# Patient Record
Sex: Male | Born: 1990 | Race: Black or African American | Hispanic: No | Marital: Single | Smoking: Current every day smoker
Health system: Southern US, Community
[De-identification: ages and names within clinical notes are randomized; demographics above are authoritative.]

## PROBLEM LIST (undated history)

## (undated) DIAGNOSIS — R45851 Suicidal ideations: Secondary | ICD-10-CM

---

## 2020-02-27 ENCOUNTER — Emergency Department (HOSPITAL_COMMUNITY): Payer: No Typology Code available for payment source

## 2020-02-27 ENCOUNTER — Emergency Department (HOSPITAL_COMMUNITY)
Admission: EM | Admit: 2020-02-27 | Discharge: 2020-02-27 | Disposition: A | Discharge status: Home / Self Care | Payer: No Typology Code available for payment source | Attending: Emergency Medicine | Admitting: Emergency Medicine

## 2020-02-27 ENCOUNTER — Other Ambulatory Visit: Payer: Self-pay

## 2020-02-27 ENCOUNTER — Encounter (HOSPITAL_COMMUNITY): Payer: Self-pay | Admitting: Emergency Medicine

## 2020-02-27 DIAGNOSIS — Y92410 Unspecified street and highway as the place of occurrence of the external cause: Secondary | ICD-10-CM | POA: Insufficient documentation

## 2020-02-27 DIAGNOSIS — S20219A Contusion of unspecified front wall of thorax, initial encounter: Secondary | ICD-10-CM | POA: Insufficient documentation

## 2020-02-27 DIAGNOSIS — S299XXA Unspecified injury of thorax, initial encounter: Secondary | ICD-10-CM | POA: Diagnosis present

## 2020-02-27 DIAGNOSIS — K760 Fatty (change of) liver, not elsewhere classified: Secondary | ICD-10-CM | POA: Diagnosis not present

## 2020-02-27 DIAGNOSIS — R1011 Right upper quadrant pain: Secondary | ICD-10-CM | POA: Diagnosis not present

## 2020-02-27 LAB — CBC
HCT: 43.6 % (ref 39.0–52.0)
Hemoglobin: 15.3 g/dL (ref 13.0–17.0)
MCH: 29.9 pg (ref 26.0–34.0)
MCHC: 35.1 g/dL (ref 30.0–36.0)
MCV: 85.2 fL (ref 80.0–100.0)
Platelets: 243 10*3/uL (ref 150–400)
RBC: 5.12 MIL/uL (ref 4.22–5.81)
RDW: 13 % (ref 11.5–15.5)
WBC: 14.2 10*3/uL — ABNORMAL HIGH (ref 4.0–10.5)
nRBC: 0 % (ref 0.0–0.2)

## 2020-02-27 LAB — BASIC METABOLIC PANEL
Anion gap: 12 (ref 5–15)
BUN: 10 mg/dL (ref 6–20)
CO2: 22 mmol/L (ref 22–32)
Calcium: 9.4 mg/dL (ref 8.9–10.3)
Chloride: 103 mmol/L (ref 98–111)
Creatinine, Ser: 1.16 mg/dL (ref 0.61–1.24)
GFR, Estimated: >60 mL/min (ref >60)
Glucose, Bld: 86 mg/dL (ref 70–99)
Potassium: 4.5 mmol/L (ref 3.5–5.1)
Sodium: 137 mmol/L (ref 135–145)

## 2020-02-27 MED ORDER — OXYCODONE-ACETAMINOPHEN 5-325 MG PO TABS
1.0000 | ORAL_TABLET | ORAL | Status: DC | PRN
  Administered 2020-02-27: 1 via ORAL
  Filled 2020-02-27: qty 1

## 2020-02-27 MED ORDER — FENTANYL CITRATE (PF) 100 MCG/2ML IJ SOLN
50.0000 ug | Freq: Once | INTRAMUSCULAR | Status: AC
  Administered 2020-02-27: 50 ug via INTRAVENOUS
  Filled 2020-02-27: qty 2

## 2020-02-27 MED ORDER — NAPROXEN 500 MG PO TABS: 500.0000 mg | ORAL_TABLET | Freq: Two times a day (BID) | ORAL | 0 refills | Status: DC

## 2020-02-27 MED ORDER — FENTANYL CITRATE (PF) 100 MCG/2ML IJ SOLN: 100.0000 ug | Freq: Once | INTRAMUSCULAR | Status: DC

## 2020-02-27 MED ORDER — IOHEXOL 300 MG/ML  SOLN
99.0000 mL | Freq: Once | INTRAMUSCULAR | Status: AC | PRN
  Administered 2020-02-27: 99 mL via INTRAVENOUS

## 2020-02-27 MED ORDER — METHOCARBAMOL 500 MG PO TABS: 1000.0000 mg | ORAL_TABLET | Freq: Four times a day (QID) | ORAL | 0 refills | Status: DC

## 2020-02-27 NOTE — ED Notes (Signed)
pts grandmother Corrie Dandy wants a call when pt gets back to a room.
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
## 2020-02-27 NOTE — ED Notes (Signed)
Pt approached this tech stating he feels sob. Let Clydie Braun Triage RN know.
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
## 2020-02-27 NOTE — ED Notes (Signed)
Patient verbalizes understanding of discharge instructions. Opportunity for questioning and answers were provided. Armband removed by staff, pt discharged from ED.  

## 2020-02-27 NOTE — ED Notes (Addendum)
Pt reports pain across chest and SOB.  Brought back to triage for EKG and vitals.  Chest x-ray already resulted.
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
## 2020-11-25 ENCOUNTER — Emergency Department (HOSPITAL_COMMUNITY): Payer: Self-pay

## 2020-11-25 ENCOUNTER — Emergency Department (HOSPITAL_COMMUNITY)
Admission: EM | Admit: 2020-11-25 | Discharge: 2020-11-28 | Disposition: A | Discharge status: Other Acute Inpatient Hospital | Payer: Self-pay | Attending: Emergency Medicine | Admitting: Emergency Medicine

## 2020-11-25 ENCOUNTER — Other Ambulatory Visit: Payer: Self-pay

## 2020-11-25 DIAGNOSIS — F29 Unspecified psychosis not due to a substance or known physiological condition: Secondary | ICD-10-CM | POA: Insufficient documentation

## 2020-11-25 DIAGNOSIS — F4323 Adjustment disorder with mixed anxiety and depressed mood: Secondary | ICD-10-CM | POA: Diagnosis present

## 2020-11-25 DIAGNOSIS — M940 Chondrocostal junction syndrome [Tietze]: Secondary | ICD-10-CM | POA: Insufficient documentation

## 2020-11-25 DIAGNOSIS — Z20822 Contact with and (suspected) exposure to covid-19: Secondary | ICD-10-CM | POA: Insufficient documentation

## 2020-11-25 DIAGNOSIS — F1410 Cocaine abuse, uncomplicated: Secondary | ICD-10-CM | POA: Insufficient documentation

## 2020-11-25 DIAGNOSIS — R45851 Suicidal ideations: Secondary | ICD-10-CM

## 2020-11-25 LAB — ACETAMINOPHEN LEVEL: Acetaminophen (Tylenol), Serum: <10 ug/mL — ABNORMAL LOW (ref 10–30)

## 2020-11-25 LAB — BASIC METABOLIC PANEL
Anion gap: 10 (ref 5–15)
BUN: 9 mg/dL (ref 6–20)
CO2: 25 mmol/L (ref 22–32)
Calcium: 9.2 mg/dL (ref 8.9–10.3)
Chloride: 102 mmol/L (ref 98–111)
Creatinine, Ser: 1.14 mg/dL (ref 0.61–1.24)
GFR, Estimated: >60 mL/min (ref >60)
Glucose, Bld: 84 mg/dL (ref 70–99)
Potassium: 3.6 mmol/L (ref 3.5–5.1)
Sodium: 137 mmol/L (ref 135–145)

## 2020-11-25 LAB — CBC WITH DIFFERENTIAL/PLATELET
Abs Immature Granulocytes: 0.03 10*3/uL (ref 0.00–0.07)
Basophils Absolute: 0.1 10*3/uL (ref 0.0–0.1)
Basophils Relative: 1 %
Eosinophils Absolute: 1 10*3/uL — ABNORMAL HIGH (ref 0.0–0.5)
Eosinophils Relative: 11 %
HCT: 43.6 % (ref 39.0–52.0)
Hemoglobin: 14.4 g/dL (ref 13.0–17.0)
Immature Granulocytes: 0 %
Lymphocytes Relative: 30 %
Lymphs Abs: 2.8 10*3/uL (ref 0.7–4.0)
MCH: 28.4 pg (ref 26.0–34.0)
MCHC: 33 g/dL (ref 30.0–36.0)
MCV: 86 fL (ref 80.0–100.0)
Monocytes Absolute: 0.8 10*3/uL (ref 0.1–1.0)
Monocytes Relative: 8 %
Neutro Abs: 4.7 10*3/uL (ref 1.7–7.7)
Neutrophils Relative %: 50 %
Platelets: 266 10*3/uL (ref 150–400)
RBC: 5.07 MIL/uL (ref 4.22–5.81)
RDW: 12.7 % (ref 11.5–15.5)
WBC: 9.3 10*3/uL (ref 4.0–10.5)
nRBC: 0 % (ref 0.0–0.2)

## 2020-11-25 LAB — RESP PANEL BY RT-PCR (FLU A&B, COVID) ARPGX2
Influenza A by PCR: NEGATIVE
Influenza B by PCR: NEGATIVE
SARS Coronavirus 2 by RT PCR: NEGATIVE

## 2020-11-25 LAB — SALICYLATE LEVEL: Salicylate Lvl: <7 mg/dL — ABNORMAL LOW (ref 7.0–30.0)

## 2020-11-25 LAB — ETHANOL: Alcohol, Ethyl (B): <10 mg/dL (ref <10)

## 2020-11-25 MED ORDER — NAPROXEN 250 MG PO TABS
500.0000 mg | ORAL_TABLET | Freq: Once | ORAL | Status: AC
  Administered 2020-11-25: 500 mg via ORAL
  Filled 2020-11-25: qty 2

## 2020-11-25 NOTE — ED Provider Notes (Signed)
Patient observed throughout the afternoon awaiting placement and behavioral health recommendations.  On assessment patient is cooperative, no distress.  Patient having thoughts of suicide.  Patient voluntarily would like placement and help.  The placement that was found initially to go to [facility] patient prefers to go to a different facility.  Plan to await further behavioral health recommendations for other options.  Kenton Kingfisher, MD 11/25/20 [phone]

## 2020-11-25 NOTE — ED Notes (Signed)
Pt belongings placed in locker 2 

## 2020-11-25 NOTE — BH Assessment (Addendum)
Clinician updated shift report to clarify that pt is seeking assistance for his mental health but is refusing to be transferred to the [facility] at this time.  2338: Pt's referral information was relayed to [facility] Tosin, RN, for review for potential acceptance to [facility].

## 2020-11-25 NOTE — ED Provider Notes (Signed)
Emergency Medicine Provider Triage Evaluation Note  Eugene Steele , a 30 y.o. male  was evaluated in triage.  Pt complains of central chest pain x 4 hours which has been constant. Worse with palpation and breathing. Had temp of 100.2F in triage, but no other known fevers PTA. Denies congestion, cough, hemoptysis, leg swelling, sick contacts. No medications PTA for symptoms. Smokes 1 pack of cigarettes q 3 days.  Review of Systems  Positive: Chest pain Negative: Cough, congestion, hemoptysis  Physical Exam  Ht 5\' 10"  (1.778 m)   Wt 99.3 kg   BMI 31.42 kg/m  Gen:   Awake, no distress   Resp:  Normal effort  MSK:   Moves extremities without difficulty  Other:  Lungs grossly CTAB  Medical Decision Making  Medically screening exam initiated at 12:34 AM.  Appropriate orders placed.  Eugene Steele was informed that the remainder of the evaluation will be completed by another provider, this initial triage assessment does not replace that evaluation, and the importance of remaining in the ED until their evaluation is complete.  Chest pain - low grade temp. Plan for respiratory panel, CXR, EKG. Naproxen ordered pending formal evaluation.   Eugene Cancer, PA-C 11/25/20 0036    11/27/20, MD 11/25/20 [phone]
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
## 2020-11-25 NOTE — ED Notes (Signed)
Called 3 x no answer

## 2020-11-25 NOTE — BH Assessment (Signed)
Comprehensive Clinical Assessment (CCA) Note  11/25/2020 Eugene Steele [id number]  Disposition: Per Delphia Grates NP, patient is recommended for admission to [facility]).  [place] ED from 11/25/2020 in [place] ED from 02/27/2020 in [place] High Risk No Risk      The patient demonstrates the following risk factors for suicide: Chronic risk factors for suicide include: substance use disorder and previous suicide attempts x1 . Acute risk factors for suicide include: family or marital conflict, unemployment, and loss (financial, interpersonal, professional). Protective factors for this patient include: hope for the future. Considering these factors, the overall suicide risk at this point appears to be moderate. Patient is not appropriate for outpatient follow up.  Eugene Steele is a 30 year old male presenting to [facility] voluntarily with chief complaint of chest pain and SI with a plan to use a gun to kill himself. Patient somewhat agitated at the beginning of the assessment and states that he has repeated himself at least five times about what is going on with him and he did not feel like repeating it again. Patient states "I'm going through a lot, and I want to kill myself". Patient reports suicidal ideations for years which has worsened since August. Patient reports stressors related to losing his housing and not being able to find employment with a 4-year degree from [employer]. Patient reports he has a plan, but he is choosing not to "disclose that information". Patient informed EDP that he had a plant to shoot himself with a gun. Patient acknowledges this information and reports that his gun is at his grandmother house. Patient reports one suicide attempt last year by overdosing on fentanyl. Patient reports using fentanyl once to try and kill himself and denies any use after  that.  Patient reports issues with anxiety and depression but denies history of outpatient/inpatient treatment. Patient initially denies any drug use besides THC but later reports that he uses crack daily for the past month. Patient reports denies heavy alcohol use and the use of any other drugs. Patient has been living in [place] since December. Patient is currently homeless unsheltered since yesterday. Patient reports his grandmother helped him stay in a hotel for the past two weeks. Patient reports he has been homeless since August. Patient reports he can not live with his grandmother due to conflict with his grandfather. Patient also reports that his gun is at his grandmother house. Patient reports that his mother lives in [place] and he can live with her once he goes through detox.   Patient is oriented x4, alert and engaged. Patient is initially agitated but cooperates more as the assessment progress. Patient eye contact and speech is normal. Patient affect is appropriate with congruent mood. Patient reports SI with a plan to shoot himself and he is not able to fully contract for safety. Patient is open to detox and SA services preferably in [place] near his mother and contracts for safety contingent on receiving SA services. Patient denies HI, history of violence, AVH and SIB.   Chief Complaint:  Chief Complaint  Patient presents with   Chest Pain   Visit Diagnosis: Cocaine abuse                             Suicidal ideations    CCA Screening, Triage and Referral (STR)  Patient Reported Information How did you hear about [place]?  No data recorded What Is the Reason for Your Visit/Call Today? During discussion of costochondritis the patient does report active SI to this provider.  He reports that he has a plan to go home and "use a gun" that he has access to to "blow his brains out."  He reports to this provider that he has mentioned this SI several times during the course of his ED  evaluation today.  He denies prior or recent psychiatric care in the outpatient setting.  He is requesting psych evaluation.  How Long Has This Been Causing You Problems? 1-6 months  What Do You Feel Would Help You the Most Today? Treatment for Depression or other mood problem   Have You Recently Had Any Thoughts About Hurting Yourself? Yes  Are You Planning to Commit Suicide/Harm Yourself At This time? Yes   Have you Recently Had Thoughts About Hurting Someone Eugene Steele? No  Are You Planning to Harm Someone at This Time? No  Explanation: No data recorded  Have You Used Any Alcohol or Drugs in the Past 24 Hours? Yes  How Long Ago Did You Use Drugs or Alcohol? No data recorded What Did You Use and How Much? crack   Do You Currently Have a Therapist/Psychiatrist? No  Name of Therapist/Psychiatrist: No data recorded  Have You Been Recently Discharged From Any Office Practice or Programs? No  Explanation of Discharge From Practice/Program: No data recorded    CCA Screening Triage Referral Assessment Type of Contact: Tele-Assessment  Telemedicine Service Delivery: Telemedicine service delivery: This service was provided via telemedicine using a 2-way, interactive audio and video technology  Is this Initial or Reassessment? Initial Assessment  Date Telepsych consult ordered in CHL:  11/25/20  Time Telepsych consult ordered in CHL:  No data recorded Location of Assessment: [facility] ED  Provider Location: [facility]   Collateral Involvement: none   Does Patient Have a [place]? No data recorded Name and Contact of Legal Guardian: No data recorded If Minor and Not Living with Parent(s), Who has Custody? No data recorded Is CPS involved or ever been involved? No data recorded Is APS involved or ever been involved? No data recorded  Patient Determined To Be At Risk for Harm To Self or Others Based on Review of Patient Reported Information or  Presenting Complaint? Yes, for Self-Harm  Method: No data recorded Availability of Means: No data recorded Intent: No data recorded Notification Required: No data recorded Additional Information for Danger to Others Potential: No data recorded Additional Comments for Danger to Others Potential: No data recorded Are There Guns or Other Weapons in Your Home? No data recorded Types of Guns/Weapons: No data recorded Are These Weapons Safely Secured?                            No data recorded Who Could Verify You Are Able To Have These Secured: No data recorded Do You Have any Outstanding Charges, Pending Court Dates, Parole/Probation? No data recorded Contacted To Inform of Risk of Harm To Self or Others: No data recorded   Does Patient Present under Involuntary Commitment? No  IVC Papers Initial File Date: No data recorded  [place] of Residence: [place]   Patient Currently Receiving the Following Services: No data recorded  Determination of Need: Urgent (48 hours)   Options For Referral: Medication Management; Facility-Based Crisis; Outpatient Therapy     CCA Biopsychosocial Patient Reported Schizophrenia/Schizoaffective Diagnosis in  Past: No   Strengths: No data recorded  Mental Health Symptoms Depression:   Change in energy/activity; Difficulty Concentrating; Hopelessness; Irritability; Sleep (too much or little); Tearfulness; Worthlessness   Duration of Depressive symptoms:  Duration of Depressive Symptoms: Greater than two weeks   Mania:   None   Anxiety:    Irritability; Sleep; Tension; Worrying   Psychosis:   None   Duration of Psychotic symptoms:    Trauma:   None   Obsessions:   None   Compulsions:   None   Inattention:   None   Hyperactivity/Impulsivity:   None   Oppositional/Defiant Behaviors:   None   Emotional Irregularity:   None   Other Mood/Personality Symptoms:  No data recorded   Mental Status Exam Appearance and self-care   Stature:   Average   Weight:   Average weight   Clothing:   Age-appropriate; Neat/clean   Grooming:   Normal   Cosmetic use:   None   Posture/gait:   Normal   Motor activity:   Not Remarkable   Sensorium  Attention:   Normal   Concentration:   Normal   Orientation:   X5   Recall/memory:   Normal   Affect and Mood  Affect:   Appropriate; Congruent   Mood:   Irritable   Relating  Eye contact:   Normal   Facial expression:   Responsive   Attitude toward examiner:   Cooperative; Guarded; Irritable   Thought and Language  Speech flow:  Clear and Coherent   Thought content:   Appropriate to Mood and Circumstances   Preoccupation:   None   Hallucinations:   None   Organization:  No data recorded  [employer] of Knowledge:   Good   Intelligence:   Average   Abstraction:   Normal   Judgement:   Fair   Reality Testing:   Adequate   Insight:   Fair   Decision Making:   Normal   Social Functioning  Social Maturity:   Responsible   Social Judgement:   Normal   Stress  Stressors:   Housing; Family conflict; Financial   Coping Ability:   Exhausted; Overwhelmed   Skill Deficits:   None   Supports:   Support needed; Family     Religion:    Leisure/Recreation:    Exercise/Diet: Exercise/Diet Do You Follow a Special Diet?: No Do You Have Any Trouble Sleeping?: Yes   CCA Employment/Education Employment/Work Situation: Employment / Work Situation Employment Situation: Unemployed  Education: Education Is Patient Currently Attending School?: No Did Theme park manager?: Yes   CCA Family/Childhood History Family and Relationship History: Family history Marital status: Single Does patient have children?: No  Childhood History:  Childhood History By whom was/is the patient raised?: Both parents Did patient suffer any verbal/emotional/physical/sexual abuse as a child?: No Did patient  suffer from severe childhood neglect?: No  Child/Adolescent Assessment:     CCA Substance Use Alcohol/Drug Use: Alcohol / Drug Use Pain Medications: See MAR Prescriptions: See MAR Over the Counter: See MAR History of alcohol / drug use?: Yes Negative Consequences of Use: Personal relationships, Financial Substance #1 Name of Substance 1: THC 1 - Duration: uses 2-4 times a week, ongoing Substance #2 Name of Substance 2: Crack 2 - Age of First Use: unknown 2 - Frequency: daily 2 - Duration: 7 months 2 - Last Use / Amount: last night  ASAM's:  Six Dimensions of Multidimensional Assessment  Dimension 1:  Acute Intoxication and/or Withdrawal Potential:      Dimension 2:  Biomedical Conditions and Complications:      Dimension 3:  Emotional, Behavioral, or Cognitive Conditions and Complications:     Dimension 4:  Readiness to Change:     Dimension 5:  Relapse, Continued use, or Continued Problem Potential:     Dimension 6:  Recovery/Living Environment:     ASAM Severity Score:    ASAM Recommended Level of Treatment: ASAM Recommended Level of Treatment: Level II Intensive Outpatient Treatment   Substance use Disorder (SUD)    Recommendations for Services/Supports/Treatments: Recommendations for Services/Supports/Treatments Recommendations For Services/Supports/Treatments: Detox, Facility Based Crisis  Discharge Disposition:    DSM5 Diagnoses: There are no problems to display for this patient.    Referrals to Alternative Service(s): Referred to Alternative Service(s):   Place:   Date:   Time:    Referred to Alternative Service(s):   Place:   Date:   Time:    Referred to Alternative Service(s):   Place:   Date:   Time:    Referred to Alternative Service(s):   Place:   Date:   Time:     Luther Redo, [facility]

## 2020-11-25 NOTE — ED Notes (Signed)
Pt changed into appropriate scrubs. Provided drinks. Will provided sandwich bags when they are delivered.

## 2020-11-25 NOTE — ED Triage Notes (Signed)
Pt c/o chest pain that started 4 hours ago.

## 2020-11-25 NOTE — ED Notes (Signed)
Called safe transport to take patient to [facility]

## 2020-11-25 NOTE — ED Provider Notes (Signed)
[facility] EMERGENCY DEPARTMENT Provider Note   CSN: [id number] Arrival date & time: 11/25/20  0026     History Chief Complaint  Patient presents with   Chest Pain    Eugene Steele is a 30 y.o. male.  30 year old male with no medical history as detailed below presents for evaluation.  Patient complains of right-sided chest discomfort.  This chest discomfort started yesterday evening.  Patient reports that the pain is worse with movement.  He denies associated shortness of breath or nausea.  He denies diaphoresis.  Pain is improved after administration of Naprosyn while waiting for evaluation.  Patient denies any specific inciting event.  Patient's exam and history are consistent with likely costochondritis.  Presentation is not suggestive of ACS.  During discussion of costochondritis the patient does report active SI to this provider.  He reports that he has a plan to go home and "use a gun" that he has access to to "blow his brains out."  He reports to this provider that he has mentioned this SI several times during the course of his ED evaluation today.  He denies prior or recent psychiatric care in the outpatient setting.  He is requesting psych evaluation.  The history is provided by the patient.  Chest Pain Pain location:  R lateral chest Pain quality: aching   Pain radiates to:  Does not radiate Pain severity:  Mild Onset quality:  Gradual Duration:  1 day Timing:  Constant Progression:  Partially resolved     No past medical history on file.  There are no problems to display for this patient.   No past surgical history on file.     No family history on file.  Social History   Tobacco Use   Smoking status: Never   Smokeless tobacco: Never  Substance Use Topics   Alcohol use: Never   Drug use: Never    Home Medications Prior to Admission medications   Medication Sig Start Date End Date Taking? Authorizing Provider   methocarbamol (ROBAXIN) 500 MG tablet Take 2 tablets (1,000 mg total) by mouth 4 (four) times daily. 02/27/20   Renne Crigler, PA-C  naproxen (NAPROSYN) 500 MG tablet Take 1 tablet (500 mg total) by mouth 2 (two) times daily. 02/27/20   Renne Crigler, PA-C    Allergies    Patient has no known allergies.  Review of Systems   Review of Systems  Cardiovascular:  Positive for chest pain.  Psychiatric/Behavioral:  Positive for suicidal ideas.    Physical Exam Updated Vital Signs BP 131/86 (BP Location: Right Arm)   Pulse (!) 51   Temp 98.9 F (37.2 C)   Resp 18   Ht 5\' 10"  (1.778 m)   Wt 99.3 kg   SpO2 100%   BMI 31.42 kg/m   Physical Exam Vitals and nursing note reviewed.  Constitutional:      General: He is not in acute distress.    Appearance: Normal appearance. He is well-developed.  HENT:     Head: Normocephalic and atraumatic.  Eyes:     Conjunctiva/sclera: Conjunctivae normal.     Pupils: Pupils are equal, round, and reactive to light.  Cardiovascular:     Rate and Rhythm: Normal rate and regular rhythm.     Heart sounds: Normal heart sounds.  Pulmonary:     Effort: Pulmonary effort is normal. No respiratory distress.     Breath sounds: Normal breath sounds.  Chest:     Chest wall: Tenderness present.  Comments: Reproducible chest discomfort - tenderness over right sternum reproduces patient's reported symptoms entirely. Abdominal:     General: There is no distension.     Palpations: Abdomen is soft.     Tenderness: There is no abdominal tenderness.  Musculoskeletal:        General: No deformity. Normal range of motion.     Cervical back: Normal range of motion and neck supple.  Skin:    General: Skin is warm and dry.  Neurological:     General: No focal deficit present.     Mental Status: He is alert and oriented to person, place, and time.  Psychiatric:     Comments: Reports SI with plan to "blow brains out with a gun." He reports access to gun at  home.     ED Results / Procedures / Treatments   Labs (all labs ordered are listed, but only abnormal results are displayed) Labs Reviewed  RESP PANEL BY RT-PCR (FLU A&B, COVID) ARPGX2  BASIC METABOLIC PANEL  ETHANOL  RAPID URINE DRUG SCREEN, HOSP PERFORMED  SALICYLATE LEVEL  ACETAMINOPHEN LEVEL  CBC WITH DIFFERENTIAL/PLATELET    EKG EKG Interpretation  Date/Time:  Monday November 25 2020 00:42:28 EST Ventricular Rate:  58 PR Interval:  140 QRS Duration: 74 QT Interval:  404 QTC Calculation: 396 R Axis:   56 Text Interpretation: Sinus bradycardia Nonspecific T wave abnormality Abnormal ECG When compared with ECG of 02/27/2020, HEART RATE has decreased Confirmed by Dione Booze ([id number]) on 11/25/2020 1:28:13 AM  Radiology DG Chest 2 View  Result Date: 11/25/2020 CLINICAL DATA:  Chest pain EXAM: CHEST - 2 VIEW COMPARISON:  02/27/2020 FINDINGS: The heart size and mediastinal contours are within normal limits. Both lungs are clear. The visualized skeletal structures are unremarkable. IMPRESSION: No active cardiopulmonary disease. Electronically Signed   By: Helyn Numbers M.D.   On: 11/25/2020 01:08    Procedures Procedures   Medications Ordered in ED Medications  naproxen (NAPROSYN) tablet 500 mg (500 mg Oral Given 11/25/20 0049)    ED Course  I have reviewed the triage vital signs and the nursing notes.  Pertinent labs & imaging results that were available during my care of the patient were reviewed by me and considered in my medical decision making (see chart for details).    MDM Rules/Calculators/A&P                           MDM  MSE complete  Eugene Steele was evaluated in Emergency Department on 11/25/2020 for the symptoms described in the history of present illness. He was evaluated in the context of the global COVID-19 pandemic, which necessitated consideration that the patient might be at risk for infection with the SARS-CoV-2 virus that causes  COVID-19. Institutional protocols and algorithms that pertain to the evaluation of patients at risk for COVID-19 are in a state of rapid change based on information released by regulatory bodies including the CDC and federal and state organizations. These policies and algorithms were followed during the patient's care in the ED.  Patient is presenting with reported right-sided chest discomfort.  Patient described history and physical are consistent with likely costochondritis  During exam patient reported active SI with plan to go home and "shoot himself in the head."  He is requesting TTS eval.  He is medically clear at this time for psychiatric evaluation.  Final disposition is dependent upon psychiatric plan of care.   Final Clinical Impression(s) /  ED Diagnoses Final diagnoses:  Costochondritis  Suicidal ideation    Rx / DC Orders ED Discharge Orders     None        Wynetta Fines, MD 11/25/20 1444

## 2020-11-26 ENCOUNTER — Encounter (HOSPITAL_COMMUNITY): Payer: Self-pay | Admitting: Registered Nurse

## 2020-11-26 DIAGNOSIS — R45851 Suicidal ideations: Secondary | ICD-10-CM

## 2020-11-26 DIAGNOSIS — F4323 Adjustment disorder with mixed anxiety and depressed mood: Secondary | ICD-10-CM

## 2020-11-26 MED ORDER — ZIPRASIDONE MESYLATE 20 MG IM SOLR
INTRAMUSCULAR | Status: AC
  Filled 2020-11-26: qty 20

## 2020-11-26 MED ORDER — ZIPRASIDONE MESYLATE 20 MG IM SOLR
20.0000 mg | Freq: Once | INTRAMUSCULAR | Status: AC
  Administered 2020-11-26: 20 mg via INTRAMUSCULAR
  Filled 2020-11-26: qty 20

## 2020-11-26 NOTE — ED Notes (Signed)
After pt was told he was being placed under IVC pt started walking out to the front door. Pt was intercepted by security and staff. Pt placed in STARR walking assist and taken back to assigned bed. Pt had an outburst and punched the privacy screen. Pt currently refusing to comply with this RN or security.

## 2020-11-26 NOTE — Progress Notes (Signed)
Inpatient Behavioral Health Placement  Pt meets inpatient criteria per Assunta Found, NP. Per [facility] AC Oluwatosin, RN there are no appropriate beds at [facility]Referral was sent to the following facilities:   Destination Service Provider Address Phone Fax  [facility]  [address] [place] [address] [phone]  [facility]  [address] [place] [address] [phone]  [facility]  [address] [place], [place] [address] [phone]  [facility]  [address] [place], [place] [address] [phone]  [facility]  [address] [place] [address] [phone]  [facility]  [address] [place] [address] [phone]  [facility]  [address], [place] [address] [phone]  [facility]  [address] [place] [address] [phone]  [facility]  [address], [place] [address] [phone]  [facility]  [address], [place] [address] [phone]  [facility]  [address], [place] [address] [phone]  [facility]  [address] [place] [address] [phone]  [facility]  [address] [place] [address] [phone]  [facility]  [address] [place], [place] [address] [phone]  [facility]  [address] [place] [address] [phone]    Situation ongoing,  CSW will follow up.   Maryjean Ka, MSW, LCSWA 11/26/2020  @ 11:14 PM

## 2020-11-26 NOTE — ED Notes (Signed)
Pt requesting an update from the MD.EDP notified.Eugene Steele

## 2020-11-26 NOTE — ED Notes (Signed)
Pt gradually escalating, wanting to leave. Attempted to verbally deescalate pt. Pt became quite and sitting on stretcher with arms crossed. EDP reports IVC is forthcoming.

## 2020-11-26 NOTE — ED Notes (Signed)
Spoke with pt's grandmother and she reports she is unable to accept the pt on a Engineer, manufacturing systems. She reports that pt has made threatening remarks to family members.

## 2020-11-26 NOTE — ED Notes (Signed)
Belongings returned to locker room #2. Valuables given to security.

## 2020-11-26 NOTE — ED Notes (Signed)
Pt to nurses station saying that he wants to leave and is denying SI/HI at this time. MD and psych NP made aware

## 2020-11-26 NOTE — ED Notes (Addendum)
Pt received another placement at [facility] and they would evaluate him and place him. Pt refuses to go, states he doesn't want to go somewhere he doesn't know and wants to wait for [facility]. Explained to pt that it could be days before [facility] can take him and that they may not be able to take him, that in the ED he isn't getting the help his needs without a behavioral health placement. Pt continues to refuse to accept placement. MD notified. No additional orders received. No acute changes noted. Will continue to monitor.

## 2020-11-26 NOTE — ED Notes (Signed)
EDP at bedside discussing with pt that he will be placed as an IVC.

## 2020-11-26 NOTE — ED Provider Notes (Addendum)
Emergency Medicine Observation Re-evaluation Note  Eugene Steele is a 30 y.o. male, seen on rounds today.  Pt initially presented to the ED for complaints of Chest Pain Currently, the patient is resting.  Physical Exam  BP 123/69 (BP Location: Right Arm)   Pulse (!) 53   Temp 98.2 F (36.8 C)   Resp 16   Ht 5\' 10"  (1.778 m)   Wt 99.3 kg   SpO2 99%   BMI 31.42 kg/m  Physical Exam General: calm, resting Cardiac: warm, well perfused Lungs: even ulabored Psych: calm right now  ED Course / MDM  EKG:EKG Interpretation  Date/Time:  Monday November 25 2020 00:42:28 EST Ventricular Rate:  58 PR Interval:  140 QRS Duration: 74 QT Interval:  404 QTC Calculation: 396 R Axis:   56 Text Interpretation: Sinus bradycardia Nonspecific T wave abnormality Abnormal ECG When compared with ECG of 02/27/2020, HEART RATE has decreased Confirmed by 02/29/2020 (Dione Booze) on 11/25/2020 1:28:13 AM  I have reviewed the labs performed to date as well as medications administered while in observation.  Recent changes in the last 24 hours include overnight .  Plan  Current plan is for placement per psych.  Eugene Steele is not under involuntary commitment.     Ruthann Cancer, MD 11/26/20 0915  2:00 PM ADDENDUM - psych reevaluated and continues to recommend in patient due to concerns for suicidal ideation, have completed IVC paperwork. Updated staff.     11/28/20, MD 11/26/20 2233

## 2020-11-26 NOTE — Consult Note (Signed)
Telepsych Consultation   Reason for Consult:  Suicidal ideation Referring Physician:  Valarie Merino, MD Location of Patient: [facility] ED Location of Provider: Other: [facility]  Patient Identification: Eugene Steele MRN:  [id number] Principal Diagnosis: Acute adjustment disorder with mixed anxiety and depressed mood Diagnosis:  Principal Problem:   Acute adjustment disorder with mixed anxiety and depressed mood Active Problems:   Suicidal ideation   Total Time spent with patient: 30 minutes  Subjective:   Eugene Steele is a 30 y.o. male patient admitted to [facility] ED sent from [facility] where he initially presented with complaints of suicidal ideation with intent and plan.  HPI:  Eugene Steele, 30 y.o., male patient seen via tele health by this provider, consulted with Dr. Hampton Abbot; and chart reviewed on 11/26/20.  On evaluation Eugene Steele reports he is feeling better and wants to go home today.  "I just said that I want to go home.  I do not want to kill myself.  I do not want to think about that right now.  My grandmother called and said I had an interview and I just want to go home.  I've been unemployed for 8 months and just to get a call about interview has Gotten excited and ready to get the hell out of here.  Do I have to just walk to hell out of here."  Patient refuses to let anyone speak to his grandmother for collateral information.  Patient asked about access to a gun and reports that the again is at his cousin's house but refuses to let anyone speak to his because it.  Patient informed there would need to be a safety plan and family support before discharging home related to his earlier complaint of suicidal ideation with intent and plan.  Patient reported that he had access to a gun and was going to blow his brains out.  Patient will not contract for safety and he will not state that he is not going to kill himself only stating that he does not want to talk about it right  now. During evaluation Eugene Steele is sitting on end of bed in no acute distress.  He is alert, oriented x 4; his mood is agitated only asking minimal questions.  Repeating that he wants to leave and cursing.  He does not appear to be responding to internal/external stimuli or delusional thoughts.  Patient denies suicidal/self-harm/homicidal ideation, psychosis, and paranoia.  And is requesting to go home but refusing to contract for safety or to let any staff speak with family members  Spoke with Dr. Roslynn Amble by phone and reports that collateral information was received by patient's grandmother who informed that patient has threatened family and are fearful for him to be sent home.  Grandmother feels the patient needs psychiatric admission.    Past Psychiatric History: Denies  Risk to Self: Yes Risk to Others: Yes Prior Inpatient Therapy: Denies Prior Outpatient Therapy: Denies  Past Medical History: History reviewed. No pertinent past medical history. History reviewed. No pertinent surgical history. Family History: History reviewed. No pertinent family history. Family Psychiatric  History: Unaware Social History:  Social History   Substance and Sexual Activity  Alcohol Use Never     Social History   Substance and Sexual Activity  Drug Use Never    Social History   Socioeconomic History   Marital status: Single    Spouse name: Not on file   Number of children: Not on file   Years  of education: Not on file   Highest education level: Not on file  Occupational History   Not on file  Tobacco Use   Smoking status: Never   Smokeless tobacco: Never  Substance and Sexual Activity   Alcohol use: Never   Drug use: Never   Sexual activity: Not on file  Other Topics Concern   Not on file  Social History Narrative   Not on file   Social Determinants of Health   Financial Resource Strain: Not on file  Food Insecurity: Not on file  Transportation Needs: Not on file   Physical Activity: Not on file  Stress: Not on file  Social Connections: Not on file   Additional Social History:    Allergies:  No Known Allergies  Labs:  Results for orders placed or performed during the hospital encounter of 11/25/20 (from the past 48 hour(s))  Resp Panel by RT-PCR (Flu A&B, Covid) Nasopharyngeal Swab     Status: None   Collection Time: 11/25/20 12:49 AM   Specimen: Nasopharyngeal Swab; Nasopharyngeal(NP) swabs in vial transport medium  Result Value Ref Range   SARS Coronavirus 2 by RT PCR NEGATIVE NEGATIVE    Comment: (NOTE) SARS-CoV-2 target nucleic acids are NOT DETECTED.  The SARS-CoV-2 RNA is generally detectable in upper respiratory specimens during the acute phase of infection. The lowest concentration of SARS-CoV-2 viral copies this assay can detect is 138 copies/mL. A negative result does not preclude SARS-Cov-2 infection and should not be used as the sole basis for treatment or other patient management decisions. A negative result may occur with  improper specimen collection/handling, submission of specimen other than nasopharyngeal swab, presence of viral mutation(s) within the areas targeted by this assay, and inadequate number of viral copies(<138 copies/mL). A negative result must be combined with clinical observations, patient history, and epidemiological information. The expected result is Negative.  Fact Sheet for Patients:  EntrepreneurPulse.com.au  Fact Sheet for Healthcare Providers:  IncredibleEmployment.be  This test is no t yet approved or cleared by the [place] FDA and  has been authorized for detection and/or diagnosis of SARS-CoV-2 by FDA under an Emergency Use Authorization (EUA). This EUA will remain  in effect (meaning this test can be used) for the duration of the COVID-19 declaration under Section 564(b)(1) of the Act, 21 U.S.C.section 360bbb-3(b)(1), unless the authorization is  terminated  or revoked sooner.       Influenza A by PCR NEGATIVE NEGATIVE   Influenza B by PCR NEGATIVE NEGATIVE    Comment: (NOTE) The Xpert Xpress SARS-CoV-2/FLU/RSV plus assay is intended as an aid in the diagnosis of influenza from Nasopharyngeal swab specimens and should not be used as a sole basis for treatment. Nasal washings and aspirates are unacceptable for Xpert Xpress SARS-CoV-2/FLU/RSV testing.  Fact Sheet for Patients: EntrepreneurPulse.com.au  Fact Sheet for Healthcare Providers: IncredibleEmployment.be  This test is not yet approved or cleared by the [place] FDA and has been authorized for detection and/or diagnosis of SARS-CoV-2 by FDA under an Emergency Use Authorization (EUA). This EUA will remain in effect (meaning this test can be used) for the duration of the COVID-19 declaration under Section 564(b)(1) of the Act, 21 U.S.C. section 360bbb-3(b)(1), unless the authorization is terminated or revoked.  Performed at [facility], [place] [address] [place], [place] [id number]   Basic metabolic panel     Status: None   Collection Time: 11/25/20  9:50 AM  Result Value Ref Range   Sodium 137 135 -  145 mmol/L   Potassium 3.6 3.5 - 5.1 mmol/L   Chloride 102 98 - 111 mmol/L   CO2 25 22 - 32 mmol/L   Glucose, Bld 84 70 - 99 mg/dL    Comment: Glucose reference range applies only to samples taken after fasting for at least 8 hours.   BUN 9 6 - 20 mg/dL   Creatinine, Ser 0.86 0.61 - 1.24 mg/dL   Calcium 9.2 8.9 - 57.8 mg/dL   GFR, Estimated >46 >96 mL/min    Comment: (NOTE) Calculated using the CKD-EPI Creatinine Equation (2021)    Anion gap 10 5 - 15    Comment: Performed at [facility], [address] [place], [place] [address]  Ethanol     Status: None   Collection Time: 11/25/20  9:50 AM  Result Value Ref Range   Alcohol, Ethyl (B) <10 <10 mg/dL    Comment: (NOTE) Lowest detectable limit for serum  alcohol is 10 mg/dL.  For medical purposes only. Performed at [facility], [address] [place], [place] [address]   Salicylate level     Status: Abnormal   Collection Time: 11/25/20  9:50 AM  Result Value Ref Range   Salicylate Lvl <7.0 (L) 7.0 - 30.0 mg/dL    Comment: Performed at [facility], [address], [place], [place] [address]  Acetaminophen level     Status: Abnormal   Collection Time: 11/25/20  9:50 AM  Result Value Ref Range   Acetaminophen (Tylenol), Serum <10 (L) 10 - 30 ug/mL    Comment: (NOTE) Therapeutic concentrations vary significantly. A range of 10-30 ug/mL  may be an effective concentration for many patients. However, some  are best treated at concentrations outside of this range. Acetaminophen concentrations >150 ug/mL at 4 hours after ingestion  and >50 ug/mL at 12 hours after ingestion are often associated with  toxic reactions.  Performed at [facility], [address], [place], [place] [address]   CBC with Differential     Status: Abnormal   Collection Time: 11/25/20  9:50 AM  Result Value Ref Range   WBC 9.3 4.0 - 10.5 K/uL   RBC 5.07 4.22 - 5.81 MIL/uL   Hemoglobin 14.4 13.0 - 17.0 g/dL   HCT 64.4 03.4 - 74.2 %   MCV 86.0 80.0 - 100.0 fL   MCH 28.4 26.0 - 34.0 pg   MCHC 33.0 30.0 - 36.0 g/dL   RDW 59.5 63.8 - 75.6 %   Platelets 266 150 - 400 K/uL   nRBC 0.0 0.0 - 0.2 %   Neutrophils Relative % 50 %   Neutro Abs 4.7 1.7 - 7.7 K/uL   Lymphocytes Relative 30 %   Lymphs Abs 2.8 0.7 - 4.0 K/uL   Monocytes Relative 8 %   Monocytes Absolute 0.8 0.1 - 1.0 K/uL   Eosinophils Relative 11 %   Eosinophils Absolute 1.0 (H) 0.0 - 0.5 K/uL   Basophils Relative 1 %   Basophils Absolute 0.1 0.0 - 0.1 K/uL   Immature Granulocytes 0 %   Abs Immature Granulocytes 0.03 0.00 - 0.07 K/uL    Comment: Performed at [facility], [address] [place], [place] [address]    Medications:  No current facility-administered  medications for this encounter.   Current Outpatient Medications  Medication Sig Dispense Refill   methocarbamol (ROBAXIN) 500 MG tablet Take 2 tablets (1,000 mg total) by mouth 4 (four) times daily. (Patient  not taking: No sig reported) 30 tablet 0   naproxen (NAPROSYN) 500 MG tablet Take 1 tablet (500 mg total) by mouth 2 (two) times daily. (Patient not taking: No sig reported) 30 tablet 0    Musculoskeletal: Strength & Muscle Tone: within normal limits Gait & Station: normal Patient leans: N/A  Psychiatric Specialty Exam:  Presentation  General Appearance: Appropriate for Environment Eye Contact:Good Speech:Clear and Coherent; Normal Rate Speech Volume:Normal Handedness:Right  Mood and Affect  Mood:Angry; Dysphoric Affect:Congruent  Thought Process  Thought Processes:Coherent; Linear Descriptions of Associations:Circumstantial Orientation:Full (Time, Place and Person) Thought Content:WDL History of Schizophrenia/Schizoaffective disorder:No  Duration of Psychotic Symptoms:No data recorded Hallucinations:No data recorded Ideas of Reference:None Suicidal Thoughts:Suicidal Thoughts: No (Today denies suicidal ideation.  Unwilling to set up safety plan, or let speak with family) Homicidal Thoughts:Homicidal Thoughts: No  Sensorium  Memory:Immediate Good; Recent Good Judgment:Fair Insight:Fair; Present  Executive Functions  Concentration:Good Attention Span:Good [place] of Knowledge:Good Language:Good  Psychomotor Activity  Psychomotor Activity:Psychomotor Activity: Normal  Assets  Assets:Communication Skills; Housing; Social Support  Sleep  Sleep:Sleep: Good   Physical Exam: Physical Exam Vitals and nursing note reviewed. Exam conducted with a chaperone present.  Constitutional:      General: He is not in acute distress.    Appearance: Normal appearance. He is not ill-appearing.  Cardiovascular:     Rate and Rhythm: Normal rate.  Pulmonary:      Effort: Pulmonary effort is normal.  Neurological:     Mental Status: He is alert and oriented to person, place, and time.  Psychiatric:        Attention and Perception: Attention and perception normal. He does not perceive auditory or visual hallucinations.        Mood and Affect: Mood is anxious. Affect is angry.        Speech: Speech normal.        Behavior: Behavior is agitated.        Thought Content: Thought content normal. Thought content is not paranoid or delusional. Thought content does not include homicidal or suicidal ideation.        Cognition and Memory: Cognition and memory normal.        Judgment: Judgment is impulsive.   Review of Systems  Constitutional: Negative.   HENT: Negative.    Eyes: Negative.   Respiratory: Negative.    Cardiovascular: Negative.   Gastrointestinal: Negative.   Genitourinary: Negative.   Musculoskeletal: Negative.   Skin: Negative.   Neurological: Negative.   Endo/Heme/Allergies: Negative.   Psychiatric/Behavioral:  Positive for depression, substance abuse and suicidal ideas. Negative for hallucinations. The patient is nervous/anxious.   Blood pressure 123/69, pulse (!) 53, temperature 98.2 F (36.8 C), resp. rate 16, height 5\' 10"  (1.778 m), weight 99.3 kg, SpO2 99 %. Body mass index is 31.42 kg/m.  Treatment Plan Summary: Daily contact with patient to assess and evaluate symptoms and progress in treatment, Medication management, and Plan inpatient psychiatric treatment  Disposition: Inpatient psychiatric treatment Recommend psychiatric Inpatient admission when medically cleared. No change in disposition unless safety plan and family involved.     This service was provided via telemedicine using a 2-way, interactive audio and video technology.  Names of all persons participating in this telemedicine service and their role in this encounter. Name: Earleen Newport Role: NP  Name: Dr. Hampton Abbot Role: Psychiatrist  Name: Valora Corporal Role: Patient  Name:  Role:    Spoke to patients nurse via telepsych machine Esmond Plants, RN informed  that patient denies suicidal ideation but unwilling to set up safety plan and doesn't want to allow anyone to speak to family.  If able to speak with grandmother that patient lives with or cousin whom he states has the gun; patient will need to be IVC'ed.  If family felt patient safe grandmother and he doesn't have access to gun and safety plan in place with outpatient resources could discharge home to make it to his interview.    Secure message to Esmond Plants, RN and Amedeo Gory, RN:  Just spoke to Dr. Roslynn Amble if patients' grandmother feels that patient is safe and safety plan in place:  Mental health resources and other crisis services in the community: He is instructed to call 911 and present to the nearest emergency room should he experience any suicidal/homicidal ideation, auditory/visual/hallucinations, or detrimental worsening of hi mental health condition.  Grandmother agrees; and grandmother can contact patient's cousin so that he  has no access to a gun.    Addendum: After speaking with Dr. Roslynn Amble via telephone and an update of collateral information gathered by patient's grandmother given recommending inpatient psychiatric treatment. Secure message sent to patient's nurse Esmond Plants, RN and Amedeo Gory, RN:  informing: Psychiatric assessment completed and patient continues to be recommended for inpatient psychiatric treatment.  If no available beds at [facility] patient will be faxed out to surrounding facilities for appropriate bed  Eugene Rodeheaver, NP 11/26/2020 1:40 PM

## 2020-11-27 LAB — RAPID URINE DRUG SCREEN, HOSP PERFORMED
Amphetamines: NOT DETECTED
Barbiturates: NOT DETECTED
Benzodiazepines: NOT DETECTED
Cocaine: POSITIVE — AB
Opiates: NOT DETECTED
Tetrahydrocannabinol: POSITIVE — AB

## 2020-11-27 NOTE — ED Notes (Signed)
Spoke to SW at this time who states pt has been faxed out to multiple places for inpatient treatment with no answer from facilities at this time. Will continue to monitor.

## 2020-11-27 NOTE — ED Notes (Signed)
Dinner orders emailed and confirmed by phone to satisfy concerns about accuracy expressed by one of the pt's.

## 2020-11-27 NOTE — ED Notes (Signed)
Rec'd call from [facility]. Please fax UDS report to [phone] when available.

## 2020-11-27 NOTE — ED Notes (Signed)
Pt's UDA results was refaxed to [place] at [facility] at this time.

## 2020-11-27 NOTE — ED Notes (Signed)
This RN chose to allow this pt to order from the menu.  We had a discussion about his frustration with being here and being IVC'd.  He stated that a doctor thought that he said he was gonna shoot himself with his gun but that he doesn't have a gun so he never would have said that.  I explained that he could have been IVC'd for simply expressing self-harm without being specific about a plan.  I explained that the doctors have a legal reason to IVC when they are concerned about safety of pt.    He asked if his mom could reverse the IVC and I said not if the doctor took out the IVC.   He is frustrated with feeling trapped here.  I tried to explain the process of assessment and placement.   I told him I would look into his chart and try to help him understand more what the plan is when I could.

## 2020-11-27 NOTE — ED Notes (Signed)
Pt's UDA results was faxed to [facility] at [facility].

## 2020-11-27 NOTE — ED Notes (Signed)
Post meal, Pt is quietly in room at this time.

## 2020-11-27 NOTE — Progress Notes (Signed)
Patient has been faxed out per the recommendation of Ophelia Shoulder, NP. Patient has been faxed out to the following facilities:    [facility]  [address], [place] [address] [phone]  [facility] [address]  [address], [place] [address] [phone]  [facility]  [address] [place], [place] [address] [phone]  [facility]  [address] [place], [place] [address] [phone]  [facility]  [address] [place] [address] [phone]  [facility]  [address] [place] [address] [phone]  [facility]  [address] [place] [address] [phone]  [facility]  [address], [place] [address] [phone]  [facility]  [address], [place] [address] [phone]  [facility]  [address], [place] [address] [phone]  [facility]  [address] [place] [address] [phone]  [facility]  [address] [place] [address] [phone]  [facility]  [address], [place] [address] [phone]  [facility]  [address] [place], [place] [address] [phone]  [facility]  [address] [place] [address] [phone]   Damita Dunnings, MSW, LCSW-A  1:55 PM 11/27/2020

## 2020-11-27 NOTE — ED Notes (Signed)
I have reviewed the notes from chart and recognize that what pt told me is not consistent with story he has told others.  I will wait to carefully discuss details with him further if he asks. He has not asked to discuss plans further at this time. No scheduled meds or PRN's are ordered at this time.

## 2020-11-28 ENCOUNTER — Encounter (HOSPITAL_COMMUNITY): Payer: Self-pay | Admitting: Behavioral Health

## 2020-11-28 ENCOUNTER — Inpatient Hospital Stay (HOSPITAL_COMMUNITY)
Admission: AD | Admit: 2020-11-28 | Discharge: 2020-12-01 | DRG: 882 | Disposition: A | Discharge status: Home / Self Care | Payer: Federal, State, Local not specified - Other | Source: Intra-hospital | Attending: Psychiatry | Admitting: Psychiatry

## 2020-11-28 ENCOUNTER — Other Ambulatory Visit: Payer: Self-pay

## 2020-11-28 DIAGNOSIS — Z23 Encounter for immunization: Secondary | ICD-10-CM

## 2020-11-28 DIAGNOSIS — Z20822 Contact with and (suspected) exposure to covid-19: Secondary | ICD-10-CM | POA: Diagnosis present

## 2020-11-28 DIAGNOSIS — F4010 Social phobia, unspecified: Secondary | ICD-10-CM | POA: Diagnosis present

## 2020-11-28 DIAGNOSIS — R45851 Suicidal ideations: Secondary | ICD-10-CM | POA: Diagnosis present

## 2020-11-28 DIAGNOSIS — Z91410 Personal history of adult physical and sexual abuse: Secondary | ICD-10-CM

## 2020-11-28 DIAGNOSIS — F332 Major depressive disorder, recurrent severe without psychotic features: Secondary | ICD-10-CM | POA: Diagnosis present

## 2020-11-28 DIAGNOSIS — F4323 Adjustment disorder with mixed anxiety and depressed mood: Secondary | ICD-10-CM | POA: Diagnosis present

## 2020-11-28 LAB — POC SARS CORONAVIRUS 2 AG -  ED: SARSCOV2ONAVIRUS 2 AG: NEGATIVE

## 2020-11-28 MED ORDER — HYDROXYZINE HCL 25 MG PO TABS
25.0000 mg | ORAL_TABLET | Freq: Three times a day (TID) | ORAL | Status: DC | PRN
  Administered 2020-11-28 – 2020-11-30 (×3): 25 mg via ORAL
  Filled 2020-11-28: qty 1
  Filled 2020-11-28: qty 15
  Filled 2020-11-28 (×2): qty 1

## 2020-11-28 MED ORDER — MAGNESIUM HYDROXIDE 400 MG/5ML PO SUSP: 30.0000 mL | Freq: Every day | ORAL | Status: DC | PRN

## 2020-11-28 MED ORDER — ACETAMINOPHEN 325 MG PO TABS: 650.0000 mg | ORAL_TABLET | Freq: Four times a day (QID) | ORAL | Status: DC | PRN

## 2020-11-28 MED ORDER — ALUM & MAG HYDROXIDE-SIMETH 200-200-20 MG/5ML PO SUSP: 30.0000 mL | ORAL | Status: DC | PRN

## 2020-11-28 MED ORDER — TRAZODONE HCL 50 MG PO TABS
50.0000 mg | ORAL_TABLET | Freq: Every evening | ORAL | Status: DC | PRN
  Administered 2020-11-28 – 2020-11-30 (×3): 50 mg via ORAL
  Filled 2020-11-28 (×3): qty 1

## 2020-11-28 NOTE — Progress Notes (Signed)
Pt admitted to [facility] from [facility] under IVC status. Pt was initially seen for complain of chest pain after which he verbalized intrusive thoughts of SI. Presents to [facility] with fair eye contact, logical, soft speech, animated but anxious on interactions. Denies SI, HI, AVH and pain when assessed. Per pt "I have these thoughts every blue moon because I worry too much about stuff. I talk to my mom who's also a nurse. I feel like she's my therapist because she's always able to help me calm down". Reports his stressor at this time is his current workload and not getting along with his boss. Per pt he works from home with [employer]. 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 States lives with his grandma and will return to her home post discharge. This is pt's first in patient hospitalization for mental health. Pt cooperative with skin assessment, no areas of skin breakdown noted. Tattoo noted to right side of pt's neck and on pt's left arm. Pt had only his cell phone with him on arrival to unit "They said they lost my clothes at [facility]". Pt ambulatory to unit with steady gait. Unit orientation done, routines discussed, care plan reviewed with pt and admission documents signed. Q 15 minutes safety checks initiated without self harm gestures or outburst to note thus far. Emotional support and reassurance offered to pt. Writer encouraged pt to voice concerns. Pt showered, tolerated fluids and snacks well when offered. Observed on phone with family. Denies concerns at this time.

## 2020-11-28 NOTE — Progress Notes (Signed)
Pt accepted to [facility] 400-2    Patient meets inpatient criteria per Liborio Nixon, NP  The attending provider will be [place], MD  Call report to [phone]    [facility], RN @ [facility] ED notified.     Pt scheduled  to arrive at [facility] today by 12 Noon, pending negative COVID test.     Damita Dunnings, MSW, LCSW-A  11:28 AM 11/28/2020

## 2020-11-28 NOTE — ED Notes (Signed)
Patient was given a Cup of Ginger ale.

## 2020-11-28 NOTE — ED Notes (Signed)
Pt resting in bed on left side. NAD noted. Even & unlabored respirations. Care Plan Continued

## 2020-11-28 NOTE — ED Provider Notes (Signed)
Emergency Medicine Observation Re-evaluation Note  Eugene Steele is a 30 y.o. male, seen on rounds today.  Pt initially presented to the ED for complaints of Chest Pain Currently, the patient is resting comfortably.  Physical Exam  BP (!) 98/52 (BP Location: Left Arm)   Pulse (!) 57   Temp 97.7 F (36.5 C) (Oral)   Resp 13   Ht 5\' 10"  (1.778 m)   Wt 99.3 kg   SpO2 98%   BMI 31.42 kg/m  Physical Exam General: Awake and alert Lungs: Resp even and unlabored Psych: Calm and cooperative  ED Course / MDM  EKG:EKG Interpretation  Date/Time:  Monday November 25 2020 00:42:28 EST Ventricular Rate:  58 PR Interval:  140 QRS Duration: 74 QT Interval:  404 QTC Calculation: 396 R Axis:   56 Text Interpretation: Sinus bradycardia Nonspecific T wave abnormality Abnormal ECG When compared with ECG of 02/27/2020, HEART RATE has decreased Confirmed by 02/29/2020 (Dione Booze) on 11/25/2020 1:28:13 AM  I have reviewed the labs performed to date as well as medications administered while in observation.  Recent changes in the last 24 hours include none.  Plan  Current plan is for Psych admission. Eugene Steele is under involuntary commitment.      Ruthann Cancer, MD 11/28/20 [phone]

## 2020-11-28 NOTE — Tx Team (Signed)
Initial Treatment Plan 11/28/2020 7:00 PM [name] KKD:[id number]    PATIENT STRESSORS: Financial difficulties   Substance abuse     PATIENT STRENGTHS: Capable of independent living  Education administrator  Physical Health  Special hobby/interest  Supportive family/friends  Work skills    PATIENT IDENTIFIED PROBLEMS: Risk for suicide "I do have these thoughts of hurting myself sometimes but not all the time".    Substance Abuse "I only smoke weed sometimes" (UDS +for THC & Cocaine).    Alterations in mood "I'm anxious being in here".             DISCHARGE CRITERIA:  Improved stabilization in mood, thinking, and/or behavior Verbal commitment to aftercare and medication compliance Withdrawal symptoms are absent or subacute and managed without 24-hour nursing intervention  PRELIMINARY DISCHARGE PLAN: Outpatient therapy Return to previous living arrangement Return to previous work or school arrangements  PATIENT/FAMILY INVOLVEMENT: This treatment plan has been presented to and reviewed with the patient, Eugene Steele.  The patient have been given the opportunity to ask questions and make suggestions.  Sherryl Manges, RN 11/28/2020, 7:00 PM

## 2020-11-28 NOTE — ED Notes (Signed)
Patient signed for valuable belongings from security that included phone and 2 cards. Attempted to find patient belongings that were charted to be in locker 2 without success. Searched other locker for patient labeled belongings without success. Patient states he gave belongings in bag to nurse at the time and was told it was sent to be locked up. The locker that is supposed to have his things in them held another patient belongings. Said other patient was sent out this AM to other facility. Called [facility] to follow up with that patient belongings, they state he has correct belongings there. Unable to locate Mcmillians belongings at this time. Patient was escorted to [facility] with [employer]. Instructed patient to follow up ASAP.  Belongings missing are as followed: White Educational psychologist joggers Black/white flip flops Green long socks Black m&m hoodie

## 2020-11-28 NOTE — ED Notes (Signed)
Ambulatory to restroom without difficulty. Calm and cooperative.

## 2020-11-28 NOTE — [facility] Group Notes (Signed)
[facility] Group Notes:  (Nursing/MHT/Case Management/Adjunct)  Date:  11/28/2020  Time:  8:55 PM  Type of Therapy:  Psychoeducational Skills  Participation Level:  Active  Participation Quality:  Appropriate  Affect:  Appropriate  Cognitive:  Appropriate  Insight:  Improving  Engagement in Group:  Improving  Modes of Intervention:  Discussion  Summary of Progress/Problems: The patient shared in group that he was pleased just to be here after spending multiple days in the hospital. His goal for tomorrow is to continue to get better.   Eugene Steele 11/28/2020, 8:55 PM

## 2020-11-28 NOTE — Progress Notes (Signed)
Pt stated he was feeling much better. Pt appears to have a  different perspective on his situation this evening. Pt visible on the unit    11/28/20 2200  Psych Admission Type (Psych Patients Only)  Admission Status Involuntary  Psychosocial Assessment  Patient Complaints Anxiety  Eye Contact Fair  Facial Expression Anxious  Affect Appropriate to circumstance  Speech Logical/coherent;Soft  Interaction Assertive  Motor Activity Fidgety  Appearance/Hygiene In scrubs  Behavior Characteristics Cooperative  Mood Anxious  Aggressive Behavior  Targets Self  Type of Behavior Verbal ("I was having suicidal thoughts whick I do every now & then but I'm fine".)  Effect No apparent injury  Thought Process  Coherency Circumstantial  Content Blaming others  Delusions None reported or observed  Perception WDL  Hallucination None reported or observed  Judgment Poor  Confusion None  Danger to Self  Current suicidal ideation? Denies  Danger to Others  Danger to Others None reported or observed

## 2020-11-28 NOTE — ED Notes (Signed)
Patient using phone, calm and cooperative

## 2020-11-28 NOTE — ED Notes (Signed)
Patient made first phone call, and patient refused his snack.

## 2020-11-28 NOTE — ED Notes (Signed)
Patient voicing request to be re evaled. States he was going through a lot when he checked into the ED for chest pain and now he has all his rights taken away. He wants to be re evaled and be considered for outpatient treatment. States what makes him feel the best is spending time with family, which he will be at Thanksgiving. CW/SW notified.

## 2020-11-28 NOTE — ED Notes (Signed)
Telephone report given to [facility] at [facility].

## 2020-11-28 NOTE — ED Notes (Signed)
Charge RN notified of patient lost belongings

## 2020-11-28 NOTE — Group Note (Signed)
Date:  11/28/2020 Time:  5:07 PM  Group Topic/Focus:  Self Care:   The focus of this group is to help patients understand the importance of self-care in order to improve or restore emotional, physical, spiritual, interpersonal, and financial health.    Participation Level:  Active  Participation Quality:  Appropriate  Affect:  Appropriate  Cognitive:  Appropriate  Insight: Appropriate  Engagement in Group:  Engaged  Modes of Intervention:  Education  Additional Comments:  Patient was educated on how helping others as a way to improve his emotional wellbeing.Patient thought of ways he has helped others and how it made him feel about himself.  Reymundo Poll 11/28/2020, 5:07 PM

## 2020-11-28 NOTE — Group Note (Signed)
Date:  11/28/2020 Time:  4:01 PM  Group Topic/Focus:  Dimensions of Wellness:   The focus of this group is to introduce the topic of wellness and discuss the role each dimension of wellness plays in total health.    Participation Level:  Did Not Attend  Participation Quality:    Affect:    Cognitive:    Insight:   Engagement in Group:    Modes of Intervention:    Additional Comments:    Reymundo Poll 11/28/2020, 4:01 PM

## 2020-11-28 NOTE — ED Notes (Signed)
Patient resting. No distress noted. Breakfast tray delivered

## 2020-11-29 ENCOUNTER — Encounter (HOSPITAL_COMMUNITY): Payer: Self-pay

## 2020-11-29 DIAGNOSIS — F4323 Adjustment disorder with mixed anxiety and depressed mood: Principal | ICD-10-CM

## 2020-11-29 LAB — HEMOGLOBIN A1C
Hgb A1c MFr Bld: 5.3 % (ref 4.8–5.6)
Mean Plasma Glucose: 105.41 mg/dL

## 2020-11-29 LAB — LIPID PANEL
Cholesterol: 121 mg/dL (ref 0–200)
HDL: 34 mg/dL — ABNORMAL LOW (ref >40)
LDL Cholesterol: 29 mg/dL (ref 0–99)
Total CHOL/HDL Ratio: 3.6 RATIO
Triglycerides: 290 mg/dL — ABNORMAL HIGH (ref <150)
VLDL: 58 mg/dL — ABNORMAL HIGH (ref 0–40)

## 2020-11-29 LAB — TSH: TSH: 0.715 u[IU]/mL (ref 0.350–4.500)
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
## 2020-11-29 NOTE — Group Note (Signed)
Recreation Therapy Group Note   Group Topic:Stress Management  Group Date: 11/29/2020 Start Time: 0930 End Time: 0945 Facilitators: Caroll Rancher, [place] Location: [address] Day[address]   Goal Area(s) Addresses:  Patient will identify positive stress management techniques. Patient will identify benefits of using stress management post d/c.  Group Description:  Meditation.  LRT played a meditation that focused on setting personal boundaries putting, saying to others can be a way of saying yes to yourself and putting yourself first as a form of self care.  Patients were to listen and follow along as meditation played to fully engage.     Affect/Mood: Appropriate   Participation Level: Engaged   Participation Quality: Independent   Behavior: Appropriate   Speech/Thought Process: Focused   Insight: Good   Judgement: Good   Modes of Intervention: Meditation   Patient Response to Interventions:  Engaged   Education Outcome:  Acknowledges education and In group clarification offered    Clinical Observations/Individualized Feedback: Pt attended and participated in group session.     Plan: Continue to engage patient in RT group sessions 2-3x/week.   Caroll Rancher, LRT,CTRS 11/29/2020 11:03 AM

## 2020-11-29 NOTE — [facility] Group Notes (Signed)
Tech provided worksheets to patient due to Covid, instant of during physical group.

## 2020-11-29 NOTE — [facility] Group Notes (Signed)
[facility] Group Notes:  (Nursing/MHT/Case Management/Adjunct)  Date:  11/29/2020  Time:  8:44 PM  Type of Therapy:  Psychoeducational Skills  Participation Level:  Active  Participation Quality:  Appropriate  Affect:  Appropriate  Cognitive:  Appropriate  Insight:  Improving  Engagement in Group:  Engaged  Modes of Intervention:  Education  Summary of Progress/Problems: The patient attended the evening wrap-up and was appropriate.   Hazle Coca S 11/29/2020, 8:44 PM

## 2020-11-29 NOTE — B[facility] Counselor (Signed)
Adult Comprehensive Assessment  Patient ID: Eugene Steele, male   DOB: 04/20/1990, 30 y.o.   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, music / Job issues: Pt reports working at [employer] Relationships: Pt reports no stressors Surveyor, quantity / Lack of resources (include bankruptcy): Pt reports no stressors Housing / Lack of housing: Pt reports living alone in his own home Physical health (include injuries & life threatening diseases): Pt reports no stressors Social relationships: Pt reports having few social relationships Substance abuse: Pt reports using Marijuana twice a month, drinking Alcohol socially, and using Cocaine occasionally Bereavement / Loss: Pt reports no stressors  Living/Environment/Situation:  Living Arrangements: Alone Living conditions (as described by patient or guardian): Home/Renting Who else lives in the home?: Alone How long has patient lived in current situation?: 3 months What is atmosphere in current home: Comfortable  Family History:  Marital status: Single Are you sexually active?: Yes What is your sexual orientation?: "Gay" Has your sexual activity been affected by drugs, alcohol, medication, or emotional stress?: No Does patient have children?: No  Childhood History:  By whom was/is the patient raised?: Mother Additional childhood history information: Pt reports his father was "in and out of the picture" Description of patient's relationship with caregiver when they were a child: "Things with my mother were very good" Patient's description of current relationship with people who  raised him/her: "Things are still great with my mother" How were you disciplined when you got in trouble as a child/adolescent?: Spankings and Groundings Does patient have siblings?: Yes Number of Siblings: 1 Description of patient's current relationship with siblings: "I have 1 brother and we get along pretty well but he lives in [place]" Did patient suffer any verbal/emotional/physical/sexual abuse as a child?: No Did patient suffer from severe childhood neglect?: No Has patient ever been sexually abused/assaulted/raped as an adolescent or adult?: No Was the patient ever a victim of a crime or a disaster?: No Witnessed domestic violence?: No Has patient been affected by domestic violence as an adult?: No  Education:  Highest grade of school patient has completed: 12th Grade, Bachelor Degree in Dance movement psychotherapist Currently a student?: No Learning disability?: No  Employment/Work Situation:   Employment Situation: Employed Where is Patient Currently Employed?: [employer] How Long has Patient Been Employed?: 1 year Are You Satisfied With Your Job?: Yes Do You Work More Than One Job?: No Work Stressors: Pt reports work stress with his supervisor Patient's Job has Been Impacted by Current Illness: No What is the Longest Time Patient has Held a Job?: 6 years Where was the Patient Employed at that Time?: [employer] Has Patient ever Been in the [employer]?: No  Financial Resources:   Financial resources: Income from employment, Food stamps Does patient have a representative payee or guardian?: No  Alcohol/Substance Abuse:   What has been your use of drugs/alcohol within the last 12 months?: Pt reports using Marijuana twice a month, drinking Alcohol socially, and Cocaine occasionally If attempted suicide, did drugs/alcohol play a role in this?: No Alcohol/Substance Abuse Treatment Hx: Denies past history Has alcohol/substance abuse ever caused legal problems?: No  Social Support System:    Patient's Community Support System: Poor  Describe Community Support System: Mother and Grandmother Type of faith/religion: Ephriam Knuckles How does patient's faith help to cope with current illness?: Prayer and Church  Leisure/Recreation:   Do You Have Hobbies?: Yes Leisure and Hobbies: Listening to music, going to [employer], fishing, cooking, and shopping  Strengths/Needs:   What is the patient's perception of their strengths?: Working with computers Patient states they can use these personal strengths during their treatment to contribute to their recovery: "I can learn something new every day by working with computers" Patient states these barriers may affect/interfere with their treatment: None Patient states these barriers may affect their return to the community: None Other important information patient would like considered in planning for their treatment: None  Discharge Plan:   Currently receiving community mental health services: No Patient states concerns and preferences for aftercare planning are: Pt is interested in therapy and medication management Patient states they will know when they are safe and ready for discharge when: "I am ready to go now" Does patient have access to transportation?: Yes (Grandmother) Does patient have financial barriers related to discharge medications?: Yes Patient description of barriers related to discharge medications: No medical insurance Will patient be returning to same living situation after discharge?: Yes  Summary/Recommendations:   Summary and Recommendations (to be completed by the evaluator): Sanjeev Main is a 30 year old, male, who was admitted to the hospital due to suicidal thoughts and worsening depression. The Pt reports that he eas experiencing a "rough day" and was having passive suicidal thoughts.  THe Pt reports that he is living on his own and working for [employer].  He states that he employment is stressful due to his  supervisor.  The Pt reports no family conflict but states that he only has contact with his mother and grandmother.  He states that he uses Marijuana twice a month, drinks Alcohol socially, and uses Cocaine occasionally.  The Pt reports no current or previous inpatient or outpatient substance use treatment and states that he is not interested in either option at this time.  While in the hospital the Pt can benefit from crisis stabilization, medication evaluation, group therapy, psycho-education, case management, and discharge planning.  Upon discharge the Pt would like to return to his home and follow up with a local outpatient provider for therapy and medication management.  Aram Beecham. 11/29/2020

## 2020-11-29 NOTE — Progress Notes (Signed)
Pt A & O X4. Presents animated with bright affect, fair eye contact with logical & clear speech on interactions. Denies SI, HI, AVH and pain when assessed. Reports he slept well last night with good appetite, good concentration and energy level. Engages in scheduled groups. Interacts well with peers and getting his needs met safely. Continues to minimize his substance use on interactions "I only drink little bit and just use weed, nothing much" when exploring possible triggers of anxiety with Probation officer. Emotional support and reassurance provided to pt. Q 15 minutes safety checks maintained on unit. Encouraged pt to voice concerns.  Pt tolerates meals well. States "I feel better after talking to the doctor and social worker, especially about follow up with care; like seeing a therapist when I leave here". Continues to endorse anxiety but declines PRN Vistaril when offered "It's only like a 3/10. I will let you know definitely when I do need it".

## 2020-11-29 NOTE — [facility] Group Notes (Signed)
Adult Psychoeducational Group Note  Date:  11/29/2020 Time:  3:26 PM  Group Topic/Focus:  Goals Group:   The focus of this group is to help patients establish daily goals to achieve during treatment and discuss how the patient can incorporate goal setting into their daily lives to aide in recovery.  Participation Level:  Active  Participation Quality:  Appropriate  Affect:  Appropriate  Cognitive:  Appropriate  Insight: Appropriate  Engagement in Group:  Engaged  Modes of Intervention:  Discussion  Additional Comments:  Patient attended morning orientation/goal group and participated.   [place] Emmitt W Shant Hence 11/29/2020, 3:26 PM
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
## 2020-11-29 NOTE — Group Note (Signed)
LCSW Group Therapy Note   Group Date: 11/29/2020 Start Time: 1300 End Time: 1400  Type of Therapy:  Group Therapy   Topic of Therapy:  Gratitude   Participation Level:  Active   Due to the COVID-19 pandemic, this group has been supplemented with worksheets.   Summary of Progress/Problems: CSW provided worksheet to patient due to COVID. CSW offered to meet individually with patient as needed  Felizardo Hoffmann, Theresia Majors 11/29/2020  1:13 PM

## 2020-11-29 NOTE — BH IP Treatment Plan (Signed)
Interdisciplinary Treatment and Diagnostic Plan Update  11/29/2020 Time of Session:  Eugene Steele MRN: [id number]  Principal Diagnosis: MDD (major depressive disorder), recurrent severe, without psychosis ([place])  Secondary Diagnoses: Principal Problem:   MDD (major depressive disorder), recurrent severe, without psychosis ([place])   Current Medications:  Current Facility-Administered Medications  Medication Dose Route Frequency Provider Last Rate Last Admin   acetaminophen (TYLENOL) tablet 650 mg  650 mg Oral Q6H PRN White, Patrice L, NP       alum & mag hydroxide-simeth (MAALOX/MYLANTA) 200-200-20 MG/5ML suspension 30 mL  30 mL Oral Q4H PRN White, Patrice L, NP       hydrOXYzine (ATARAX/VISTARIL) tablet 25 mg  25 mg Oral TID PRN White, Patrice L, NP   25 mg at 11/28/20 2205   magnesium hydroxide (MILK OF MAGNESIA) suspension 30 mL  30 mL Oral Daily PRN White, Patrice L, NP       traZODone (DESYREL) tablet 50 mg  50 mg Oral QHS PRN White, Patrice L, NP   50 mg at 11/28/20 2205   PTA Medications: Medications Prior to Admission  Medication Sig Dispense Refill Last Dose   methocarbamol (ROBAXIN) 500 MG tablet Take 2 tablets (1,000 mg total) by mouth 4 (four) times daily. (Patient not taking: No sig reported) 30 tablet 0    naproxen (NAPROSYN) 500 MG tablet Take 1 tablet (500 mg total) by mouth 2 (two) times daily. (Patient not taking: No sig reported) 30 tablet 0     Patient Stressors: Financial difficulties   Substance abuse    Patient Strengths: Capable of independent living  Electronics engineer  Physical Health  Special hobby/interest  Supportive family/friends  Work skills   Treatment Modalities: Medication Management, Group therapy, Case management,  1 to 1 session with clinician, Psychoeducation, Recreational therapy.   Physician Treatment Plan for Primary Diagnosis: MDD (major depressive disorder), recurrent severe, without psychosis ([place]) Long Term  Goal(s):     Short Term Goals:    Medication Management: Evaluate patient's response, side effects, and tolerance of medication regimen.  Therapeutic Interventions: 1 to 1 sessions, Unit Group sessions and Medication administration.  Evaluation of Outcomes: Not Met  Physician Treatment Plan for Secondary Diagnosis: Principal Problem:   MDD (major depressive disorder), recurrent severe, without psychosis ([place])  Long Term Goal(s):     Short Term Goals:       Medication Management: Evaluate patient's response, side effects, and tolerance of medication regimen.  Therapeutic Interventions: 1 to 1 sessions, Unit Group sessions and Medication administration.  Evaluation of Outcomes: Not Met   RN Treatment Plan for Primary Diagnosis: MDD (major depressive disorder), recurrent severe, without psychosis ([place]) Long Term Goal(s): Knowledge of disease and therapeutic regimen to maintain health will improve  Short Term Goals: Ability to demonstrate self-control, Ability to participate in decision making will improve, and Ability to verbalize feelings will improve  Medication Management: RN will administer medications as ordered by provider, will assess and evaluate patient's response and provide education to patient for prescribed medication. RN will report any adverse and/or side effects to prescribing provider.  Therapeutic Interventions: 1 on 1 counseling sessions, Psychoeducation, Medication administration, Evaluate responses to treatment, Monitor vital signs and CBGs as ordered, Perform/monitor CIWA, COWS, AIMS and Fall Risk screenings as ordered, Perform wound care treatments as ordered.  Evaluation of Outcomes: Not Met   LCSW Treatment Plan for Primary Diagnosis: MDD (major depressive disorder), recurrent severe, without psychosis ([place]) Long Term Goal(s): Safe transition to appropriate  next level of care at discharge, Engage patient in therapeutic group addressing interpersonal  concerns.  Short Term Goals: Engage patient in aftercare planning with referrals and resources, Increase social support, Increase ability to appropriately verbalize feelings, and Increase emotional regulation  Therapeutic Interventions: Assess for all discharge needs, 1 to 1 time with Social worker, Explore available resources and support systems, Assess for adequacy in community support network, Educate family and significant other(s) on suicide prevention, Complete Psychosocial Assessment, Interpersonal group therapy.  Evaluation of Outcomes: Not Met   Progress in Treatment: Attending groups: No. Participating in groups: No. Taking medication as prescribed: Yes. Toleration medication: Yes. Family/Significant other contact made: No, will contact:  grandma Patient understands diagnosis: Yes. Discussing patient identified problems/goals with staff: Yes. Medical problems stabilized or resolved: Yes. Denies suicidal/homicidal ideation: Yes. Issues/concerns per patient self-inventory: No. Other: None  New problem(s) identified: No, Describe:  None  New Short Term/Long Term Goal(s):medication stabilization, elimination of SI thoughts, development of comprehensive mental wellness plan.   Patient Goals:  Did Not Attend  Discharge Plan or Barriers: Patient recently admitted. CSW will continue to follow and assess for appropriate referrals and possible discharge planning.   Reason for Continuation of Hospitalization: Depression Medication stabilization Suicidal ideation  Estimated Length of Stay: 3-5 days   Scribe for Treatment Team: Eliott Nine 11/29/2020 11:47 AM
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
## 2020-11-30 MED ORDER — INFLUENZA VAC SPLIT QUAD 0.5 ML IM SUSY
0.5000 mL | PREFILLED_SYRINGE | INTRAMUSCULAR | Status: AC
  Administered 2020-12-01: 0.5 mL via INTRAMUSCULAR
  Filled 2020-11-30: qty 0.5

## 2020-11-30 NOTE — Plan of Care (Signed)
°  Problem: Education: °Goal: Emotional status will improve °Outcome: Progressing °Goal: Mental status will improve °Outcome: Progressing °Goal: Verbalization of understanding the information provided will improve °Outcome: Progressing °  °

## 2020-11-30 NOTE — Progress Notes (Signed)
Pt in stable and joyful mood. Pt anticipates and is anxious to be discharged. Pt seen interacting positively with peers. He verbalized he had a great day but is ready to leave. Denies current suicidal thoughts but stated he had thoughts in the past but did not think it warranted being admitted to a behavioral health unit. He firmly explains the ED providers misconstrued what he meant when he talked to them about his anxiety.  He stated he has a regular sleep pattern at home but while admitted has some trouble sleeping. Denies needing the use of any medications for mood or insomnia upon discharge.   Pt given PRN medication for anxiety and insomnia. Meds have shown to be effective for complaints.     11/30/20 2110  Psych Admission Type (Psych Patients Only)  Admission Status Voluntary  Psychosocial Assessment  Patient Complaints None  Eye Contact Fair  Facial Expression Anxious  Affect Appropriate to circumstance  Speech Logical/coherent  Interaction Assertive  Motor Activity Other (Comment) (WNL)  Appearance/Hygiene In scrubs;Unremarkable  Behavior Characteristics Cooperative  Mood Pleasant  Thought Process  Coherency WDL  Content WDL  Delusions None reported or observed  Perception WDL  Hallucination None reported or observed  Judgment Limited  Confusion None  Danger to Self  Current suicidal ideation? Denies  Danger to Others  Danger to Others None reported or observed

## 2020-11-30 NOTE — [facility] Group Notes (Signed)
.  Psychoeducational Group Note  Date 11/30/2020 Time: 0900-1000    Goal Setting   Purpose of Group: Group Focus: affirmation, clarity of thought, and goals/reality orientation Treatment Modality:  Psychoeducation Interventions utilized were assignment, group exercise, and support  Purpose: To be able to understand and verbalize the reason for their admission to the hospital. To understand that the medication helps with their chemical imbalance but they also need to work on their choices in life. To be challenged to develop a list of 30 positives about themselves. Also introduce the concept that "feelings" are not reality.    Participation Level:  Active  Participation Quality:  Appropriate  Affect:  Appropriate  Cognitive:  Appropriate  Insight:  Improving  Engagement in Group:  Engaged  Additional Comments: Rates himself at a 9. Invested in the group and contributing to the conversation  Eugene Steele A

## 2020-11-30 NOTE — Progress Notes (Signed)
[facility] MD Progress Note  11/30/2020 1:02 PM Eugene Steele  MRN:  [id number] Subjective:   Eugene Steele is a 30 yr old male with no PPHx who presents under IVC for SI stating plans to shoot himself with a gun.   Psychiatric Team made the following recommendation yesterday: -Continue PRN's -Monitor   Case was discussed in the multidisciplinary team. MAR was reviewed and patient was compliant with medications.  Patient required 1 dose of PRN Trazodone and Atarax.    On interview today patient reports that he is doing great today.  He reports that he slept great last night.  He reports that his appetite is doing great.  He reports no SI, HI, or AVH.  He states that he did not mean what he said the other day and that he was just stressed.  He reports that he is no longer under that significant stress and is able to think much clear.  He continues to state interest in therapy once he is discharged.  Encouraged him to continue attending group therapy to develop and work on coping skills for his significant stressors.  He reports no other concerns at present.   Principal Problem: Acute adjustment disorder with mixed anxiety and depressed mood Diagnosis: Principal Problem:   Acute adjustment disorder with mixed anxiety and depressed mood  Total Time spent with patient: 30 minutes  Past Psychiatric History: None  Past Medical History: History reviewed. No pertinent past medical history. History reviewed. No pertinent surgical history. Family History: History reviewed. No pertinent family history. Family Psychiatric  History: None Social History:  Social History   Substance and Sexual Activity  Alcohol Use Never     Social History   Substance and Sexual Activity  Drug Use Never    Social History   Socioeconomic History   Marital status: Single    Spouse name: Not on file   Number of children: Not on file   Years of education: Not on file   Highest education level: Not on  file  Occupational History   Not on file  Tobacco Use   Smoking status: Never   Smokeless tobacco: Never  Substance and Sexual Activity   Alcohol use: Never   Drug use: Never   Sexual activity: Not on file  Other Topics Concern   Not on file  Social History Narrative   Not on file   Social Determinants of Health   Financial Resource Strain: Not on file  Food Insecurity: Not on file  Transportation Needs: Not on file  Physical Activity: Not on file  Stress: Not on file  Social Connections: Not on file   Additional Social History:                         Sleep: Good  Appetite:  Good  Current Medications: Current Facility-Administered Medications  Medication Dose Route Frequency Provider Last Rate Last Admin   acetaminophen (TYLENOL) tablet 650 mg  650 mg Oral Q6H PRN White, Patrice L, NP       alum & mag hydroxide-simeth (MAALOX/MYLANTA) 200-200-20 MG/5ML suspension 30 mL  30 mL Oral Q4H PRN White, Patrice L, NP       hydrOXYzine (ATARAX/VISTARIL) tablet 25 mg  25 mg Oral TID PRN White, Patrice L, NP   25 mg at 11/29/20 2145   magnesium hydroxide (MILK OF MAGNESIA) suspension 30 mL  30 mL Oral Daily PRN Layla Barter, NP  traZODone (DESYREL) tablet 50 mg  50 mg Oral QHS PRN White, Patrice L, NP   50 mg at 11/29/20 2145    Lab Results:  Results for orders placed or performed during the hospital encounter of 11/28/20 (from the past 48 hour(s))  Hemoglobin A1c     Status: None   Collection Time: 11/29/20  6:42 AM  Result Value Ref Range   Hgb A1c MFr Bld 5.3 4.8 - 5.6 %    Comment: (NOTE) Pre diabetes:          5.7%-6.4%  Diabetes:              >6.4%  Glycemic control for   <7.0% adults with diabetes    Mean Plasma Glucose 105.41 mg/dL    Comment: Performed at [facility], [address], [place], [place] [address]  Lipid panel     Status: Abnormal   Collection Time: 11/29/20  6:42 AM  Result Value Ref Range   Cholesterol 121 0 - 200  mg/dL   Triglycerides 315 (H) <150 mg/dL   HDL 34 (L) >17 mg/dL   Total CHOL/HDL Ratio 3.6 RATIO   VLDL 58 (H) 0 - 40 mg/dL   LDL Cholesterol 29 0 - 99 mg/dL    Comment:        Total Cholesterol/HDL:CHD Risk Coronary Heart Disease Risk Table                     Men   Women  1/2 Average Risk   3.4   3.3  Average Risk       5.0   4.4  2 X Average Risk   9.6   7.1  3 X Average Risk  23.4   11.0        Use the calculated Patient Ratio above and the CHD Risk Table to determine the patient's CHD Risk.        ATP III CLASSIFICATION (LDL):  <100     mg/dL   Optimal  616-073  mg/dL   Near or Above                    Optimal  130-159  mg/dL   Borderline  710-626  mg/dL   High  >948     mg/dL   Very High Performed at [facility], [address] [place], [place] [address]   TSH     Status: None   Collection Time: 11/29/20  6:42 AM  Result Value Ref Range   TSH 0.715 0.350 - 4.500 uIU/mL    Comment: Performed by a 3rd Generation assay with a functional sensitivity of <=0.01 uIU/mL. Performed at [facility], [address], [place], [place] [address]     Blood Alcohol level:  Lab Results  Component Value Date   ETH <10 11/25/2020    Metabolic Disorder Labs: Lab Results  Component Value Date   HGBA1C 5.3 11/29/2020   MPG 105.41 11/29/2020   No results found for: PROLACTIN Lab Results  Component Value Date   CHOL 121 11/29/2020   TRIG 290 (H) 11/29/2020   HDL 34 (L) 11/29/2020   CHOLHDL 3.6 11/29/2020   VLDL 58 (H) 11/29/2020   LDLCALC 29 11/29/2020    Physical Findings: AIMS: Facial and Oral Movements Muscles of Facial Expression: None, normal Lips and Perioral Area: None, normal Jaw: None, normal Tongue: None, normal,Extremity Movements Upper (arms, wrists, hands, fingers): None, normal Lower (legs,  knees, ankles, toes): None, normal, Trunk Movements Neck, shoulders, hips: None, normal, Overall Severity Severity of  abnormal movements (highest score from questions above): None, normal Incapacitation due to abnormal movements: None, normal Patient's awareness of abnormal movements (rate only patient's report): No Awareness, Dental Status Current problems with teeth and/or dentures?: No Does patient usually wear dentures?: No  CIWA:    COWS:     Musculoskeletal: Strength & Muscle Tone: within normal limits Gait & Station: normal Patient leans: N/A  Psychiatric Specialty Exam:  Presentation  General Appearance: Appropriate for Environment; Casual; Fairly Groomed  Eye Contact:Good  Speech:Clear and Coherent; Normal Rate  Speech Volume:Normal  Handedness:Right   Mood and Affect  Mood:Euthymic  Affect:Congruent   Thought Process  Thought Processes:Coherent  Descriptions of Associations:Intact  Orientation:Full (Time, Place and Person)  Thought Content:Logical; WDL  History of Schizophrenia/Schizoaffective disorder:No  Duration of Psychotic Symptoms:No data recorded Hallucinations:Hallucinations: None  Ideas of Reference:None  Suicidal Thoughts:Suicidal Thoughts: No  Homicidal Thoughts:Homicidal Thoughts: No   Sensorium  Memory:Immediate Good; Recent Good  Judgment:Fair  Insight:Fair   Executive Functions  Concentration:Good  Attention Span:Good  Recall:Good  Fund of Knowledge:Good  Language:Good   Psychomotor Activity  Psychomotor Activity:Psychomotor Activity: Normal  Assets  Assets:Communication Skills; Physical Health; Resilience   Sleep  Sleep:Sleep: Good Number of Hours of Sleep: 6.5    Physical Exam: Physical Exam Vitals and nursing note reviewed.  Constitutional:      General: He is not in acute distress.    Appearance: Normal appearance. He is normal weight. He is not ill-appearing or toxic-appearing.  HENT:     Head: Normocephalic and atraumatic.  Pulmonary:     Effort: Pulmonary effort is normal.  Musculoskeletal:         General: Normal range of motion.  Neurological:     General: No focal deficit present.     Mental Status: He is alert.   Review of Systems  Respiratory:  Negative for cough and shortness of breath.   Cardiovascular:  Negative for chest pain.  Gastrointestinal:  Negative for abdominal pain, constipation, diarrhea, nausea and vomiting.  Neurological:  Negative for weakness and headaches.  Psychiatric/Behavioral:  Negative for depression, hallucinations and suicidal ideas. The patient is not nervous/anxious.   Blood pressure 110/66, pulse (!) 114, temperature 97.8 F (36.6 C), temperature source Oral, resp. rate 18, height 5\' 10"  (1.778 m), weight 100.4 kg, SpO2 99 %. Body mass index is 31.77 kg/m.   Treatment Plan Summary: Daily contact with patient to assess and evaluate symptoms and progress in treatment and Medication management  Eugene Steele is a 30 yr old male with no PPHx who presents under IVC for SI stating plans to shoot himself with a gun.   Patient denies/continues to minimize his symptoms.  He does report that he is interested in therapy.  He continues to staff safe on the unit and has had no issues.  We will continue to plan for discharge Monday.   Adjustment Disorder w/ Mixed Anxiety and Depressed Mood: -Continue Hydroxyzine 25 mg TID PRN -Continue Trazodone 50 mg QHS PRN   Substance abuse: -Counseled of cessation   -Continue PRN's: Tylenol, Maalox, Milk of Magnesia   Tuesday, MD 11/30/2020, 1:02 PM

## 2020-11-30 NOTE — [facility] Group Notes (Signed)
.  Psychoeducational Group Note  Date 11/30/2020 Time: 0900-1000    Goal Setting   Purpose of Group: Group Focus: affirmation, clarity of thought, and goals/reality orientation Treatment Modality:  Psychoeducation Interventions utilized were assignment, group exercise, and support  Purpose: To be able to understand and verbalize the reason for their admission to the hospital. To understand that the medication helps with their chemical imbalance but they also need to work on their choices in life. To be challenged to develop a list of 30 positives about themselves. Also introduce the concept that "feelings" are not reality.    Participation Level:  Did not attend   [place]irie Stenberg A 

## 2020-11-30 NOTE — Group Note (Signed)
LCSW Group Therapy Note  11/30/2020   10:00-11:00am   Type of Therapy and Topic:  Group Therapy: Anger Cues and Responses  Participation Level:  Active   Description of Group:   In this group, patients learned how to recognize the physical, cognitive, emotional, and behavioral responses they have to anger-provoking situations.  They identified a recent time they became angry and how they reacted.  They analyzed how their reaction was possibly beneficial and how it was possibly unhelpful.  The group discussed a variety of healthier coping skills that could help with such a situation in the future.  Focus was placed on how helpful it is to recognize the underlying emotions to our anger, because working on those can lead to a more permanent solution as well as our ability to focus on the important rather than the urgent.  Therapeutic Goals: Patients will remember their last incident of anger and how they felt emotionally and physically, what their thoughts were at the time, and how they behaved. Patients will identify how their behavior at that time worked for them, as well as how it worked against them. Patients will explore possible new behaviors to use in future anger situations. Patients will learn that anger itself is normal and cannot be eliminated, and that healthier reactions can assist with resolving conflict rather than worsening situations.  Summary of Patient Progress:  The patient shared that his most recent time of anger was last Sunday regarding his workload and said his response was to call his mother who then soothed him over the phone.  He was attentive and interactive throughout group.  Therapeutic Modalities:   Cognitive Behavioral Therapy  Lynnell Chad

## 2020-11-30 NOTE — Progress Notes (Signed)
    11/29/20 2010  Psych Admission Type (Psych Patients Only)  Admission Status Voluntary  Psychosocial Assessment  Patient Complaints Anxiety  Eye Contact Fair  Facial Expression Anxious;Animated  Affect Appropriate to circumstance  Speech Logical/coherent;Soft  Interaction Assertive  Motor Activity Fidgety  Appearance/Hygiene In scrubs  Behavior Characteristics Cooperative  Mood Pleasant;Anxious  Thought Process  Coherency Circumstantial  Content Blaming others  Delusions None reported or observed  Perception WDL  Hallucination None reported or observed  Judgment Poor  Confusion None  Danger to Self  Current suicidal ideation? Denies  Danger to Others  Danger to Others None reported or observed

## 2020-11-30 NOTE — BHH Group Notes (Signed)
Adult Psychoeducational Group Note  Date:  11/30/2020 Time:  6:43 PM  Group Topic/Focus:  Goals Group:   The focus of this group is to help patients establish daily goals to achieve during treatment and discuss how the patient can incorporate goal setting into their daily lives to aide in recovery.  Participation Level:  Active  Participation Quality:  Appropriate  Affect:  Appropriate  Cognitive:  Appropriate  Insight: Appropriate  Engagement in Group:  Engaged  Modes of Intervention:  Discussion  Additional Comments:  Patient attended morning goal setting group and participated.    Jolly Bleicher W Caleb Prigmore 11/30/2020, 6:43 PM

## 2020-11-30 NOTE — Progress Notes (Addendum)
   11/30/20 1100  Psych Admission Type (Psych Patients Only)  Admission Status Voluntary  Psychosocial Assessment  Patient Complaints None  Eye Contact Fair  Facial Expression Anxious;Animated  Affect Appropriate to circumstance  Speech Logical/coherent;Soft  Interaction Assertive  Motor Activity Fidgety  Appearance/Hygiene In scrubs  Behavior Characteristics Cooperative  Mood Anxious;Pleasant  Aggressive Behavior  Targets Self  Type of Behavior Verbal ("I was having suicidal thoughts whick I do every now & then but I'm fine".)  Effect No apparent injury  Thought Process  Coherency Circumstantial  Content Blaming others  Delusions None reported or observed  Perception WDL  Hallucination None reported or observed  Judgment Poor  Confusion None  Danger to Self  Current suicidal ideation? Denies  Danger to Others  Danger to Others None reported or observed  D. Pt is friendly upon approach- is appropriate during interactions. Pt observed in the milieu attending groups. Per pt's self inventory, pt rated his depression, hopelessness and anxiety all 0's today. Pt currently denies SI/HI and AVH A. Labs and vitals monitored.  Pt supported emotionally and encouraged to express concerns and ask questions.   R. Pt remains safe with 15 minute checks. Will continue POC.

## 2020-11-30 NOTE — Progress Notes (Signed)
Adult Psychoeducational Group Note  Date:  11/30/2020 Time:  9:12 PM  Group Topic/Focus:  Wrap-Up Group:   The focus of this group is to help patients review their daily goal of treatment and discuss progress on daily workbooks.  Participation Level:  Active  Participation Quality:  Appropriate  Affect:  Appropriate  Cognitive:  Appropriate  Insight: Appropriate  Engagement in Group:  Engaged  Modes of Intervention:  Discussion  Additional Comments:  Pt stated his goal for today was to focus on his treatment plan and to talk with his Social worker about aftercare plan. Pt stated he accomplished his goals today. Pt stated he talked with his doctor and social worker about his care today. Pt rated his overall day a 9 out of 10. Pt stated he was able to contact his mother today which improved his overall day. Pt stated he felt better about himself today.  Pt stated he took all medications provided today. Pt stated he attend all groups held today. Pt stated his appetite was pretty good today. Pt rated sleep last night was pretty good. Pt stated the goal tonight was to get some rest. Pt stated he had no physical pain today. Pt deny visual hallucinations and auditory issues tonight. Pt denies thoughts of harming himself or others. Pt stated he would alert staff if anything changed.  Felipa Furnace 11/30/2020, 9:12 PM

## 2020-12-01 LAB — RESP PANEL BY RT-PCR (FLU A&B, COVID) ARPGX2
Influenza A by PCR: NEGATIVE
Influenza B by PCR: NEGATIVE
SARS Coronavirus 2 by RT PCR: NEGATIVE

## 2020-12-01 MED ORDER — HYDROXYZINE HCL 25 MG PO TABS: 25.0000 mg | ORAL_TABLET | Freq: Three times a day (TID) | ORAL | 0 refills | Status: DC | PRN

## 2020-12-01 NOTE — Discharge Summary (Signed)
Physician Discharge Summary Note  Patient:  Eugene Steele is an 30 y.o., male MRN:  [id number] DOB:  10-13-1990 Patient phone:  [phone] (home)  Patient address:   [address] [place] [address],  Total Time spent with patient: 45 minutes  Date of Admission:  11/28/2020 Date of Discharge: 12/01/2020  Reason for Admission:   Eugene Steele is 30 year old male with initially came to [facility] with chest pain and reported intrusive suicidal thoughts.  Patient was agitated and was placed under IVC.  Patient was seen by psychiatry via Tele health.  At that time patient reported he had access to a gun and was going to blow his brains out.  Patient was not able to contract for safety but he did not want to talk about it.  Per ED physician his grandmother reported that patient has threatened family and are fearful for him to be sent home.  Patient was recommended for inpatient admission.  Patient was admitted to [facility] inpatient psychiatric unit on 11/28/2020.  Principal Problem: Acute adjustment disorder with mixed anxiety and depressed mood Discharge Diagnoses: Principal Problem:   Acute adjustment disorder with mixed anxiety and depressed mood   Past Psychiatric History: None  Past Medical History: History reviewed. No pertinent past medical history. History reviewed. No pertinent surgical history. Family History: History reviewed. No pertinent family history. Family Psychiatric  History: None Social History:  Social History   Substance and Sexual Activity  Alcohol Use Never     Social History   Substance and Sexual Activity  Drug Use Never    Social History   Socioeconomic History   Marital status: Single    Spouse name: Not on file   Number of children: Not on file   Years of education: Not on file   Highest education level: Not on file  Occupational History   Not on file  Tobacco Use   Smoking status: Never   Smokeless tobacco: Never  Substance and Sexual Activity    Alcohol use: Never   Drug use: Never   Sexual activity: Not on file  Other Topics Concern   Not on file  Social History Narrative   Not on file   Social Determinants of Health   Financial Resource Strain: Not on file  Food Insecurity: Not on file  Transportation Needs: Not on file  Physical Activity: Not on file  Stress: Not on file  Social Connections: Not on file    Hospital Course:   During hospitalization, patient was evaluated by the psychiatric team.  They received multiple disciplinary care.  After initial intake evaluation, medications were not started as he did not meet criteria.  He was interested in therapy so he was monitored to ensure no further SI was present.  HE was given PRN Trazodone and Atarax.  Patient not having side effects to PRN psychiatric medications.   Patient received supportive psychotherapy and was encouraged to participate in group therapy during the hospitalization. Patient denied having suicidal thoughts more than 48 hours prior to discharge.  On day of discharge he reports he is doing great.  He reports he slept well.  He reports his appetite is great.  He reports no SI, HI, or AVH.    Discussed with patient what to do in the event of a future crisis.  Discussed that they can return to [facility], go to the [facility], go to the nearest ED, or call 911 or [phone].  They reported understanding and had no concerns.  Patient was discharged home.   Physical Findings:   Musculoskeletal: Strength & Muscle Tone: within normal limits Gait & Station: normal Patient leans: N/A   Psychiatric Specialty Exam:  Presentation  General Appearance: Appropriate for Environment; Casual; Fairly Groomed  Eye Contact:Good  Speech:Clear and Coherent; Normal Rate  Speech Volume:Normal  Handedness:Right   Mood and Affect  Mood:Euthymic  Affect:Congruent; Appropriate   Thought Process  Thought Processes:Coherent; Goal Directed  Descriptions of  Associations:Intact  Orientation:Full (Time, Place and Person)  Thought Content:Logical; WDL  History of Schizophrenia/Schizoaffective disorder:No  Duration of Psychotic Symptoms:No data recorded Hallucinations:Hallucinations: None  Ideas of Reference:None  Suicidal Thoughts:Suicidal Thoughts: No  Homicidal Thoughts:Homicidal Thoughts: No   Sensorium  Memory:Immediate Good; Recent Good  Judgment:Fair  Insight:Fair   Executive Functions  Concentration:Good  Attention Span:Good  Recall:Good  Fund of Knowledge:Good  Language:Good   Psychomotor Activity  Psychomotor Activity:Psychomotor Activity: Normal   Assets  Assets:Communication Skills; Physical Health; Resilience   Sleep  Sleep:Sleep: Good Number of Hours of Sleep: 6.5    Physical Exam: Physical Exam Vitals and nursing note reviewed.  Constitutional:      General: He is not in acute distress.    Appearance: Normal appearance. He is normal weight. He is not ill-appearing or toxic-appearing.  HENT:     Head: Normocephalic and atraumatic.  Pulmonary:     Effort: Pulmonary effort is normal.  Musculoskeletal:        General: Normal range of motion.  Neurological:     General: No focal deficit present.     Mental Status: He is alert.   Review of Systems  Respiratory:  Negative for cough and shortness of breath.   Cardiovascular:  Negative for chest pain.  Gastrointestinal:  Negative for abdominal pain, constipation, diarrhea, nausea and vomiting.  Neurological:  Negative for weakness and headaches.  Psychiatric/Behavioral:  Negative for depression, hallucinations and suicidal ideas. The patient is not nervous/anxious.   Blood pressure 120/71, pulse (!) 108, temperature 97.8 F (36.6 C), temperature source Oral, resp. rate 18, height 5\' 10"  (1.778 m), weight 100.4 kg, SpO2 100 %. Body mass index is 31.77 kg/m.   Social History   Tobacco Use  Smoking Status Never  Smokeless Tobacco Never    Tobacco Cessation:  N/A, patient does not currently use tobacco products   Blood Alcohol level:  Lab Results  Component Value Date   ETH <10 11/25/2020    Metabolic Disorder Labs:  Lab Results  Component Value Date   HGBA1C 5.3 11/29/2020   MPG 105.41 11/29/2020   No results found for: PROLACTIN Lab Results  Component Value Date   CHOL 121 11/29/2020   TRIG 290 (H) 11/29/2020   HDL 34 (L) 11/29/2020   CHOLHDL 3.6 11/29/2020   VLDL 58 (H) 11/29/2020   LDLCALC 29 11/29/2020    See Psychiatric Specialty Exam and Suicide Risk Assessment completed by Attending Physician prior to discharge.  Discharge destination:  Home  Is patient on multiple antipsychotic therapies at discharge:  No   Has Patient had three or more failed trials of antipsychotic monotherapy by history:  No  Recommended Plan for Multiple Antipsychotic Therapies: NA  Discharge Instructions     Diet - low sodium heart healthy   Complete by: As directed    Increase activity slowly   Complete by: As directed       Allergies as of 12/01/2020   No Known Allergies      Medication List     STOP  taking these medications    methocarbamol 500 MG tablet Commonly known as: ROBAXIN   naproxen 500 MG tablet Commonly known as: NAPROSYN       TAKE these medications      Indication  hydrOXYzine 25 MG tablet Commonly known as: ATARAX/VISTARIL Take 1 tablet (25 mg total) by mouth 3 (three) times daily as needed for anxiety.  Indication: Feeling Anxious        Follow-up Information     [facility] Follow up.   Specialty: Urgent Care Why: This is one option for therapy.  Please go within 7 days of hospital discharge to start in services.  Walk-In hours are available Monday-Wednesday 8am-11am or Friday 12pm-4pm.  Services are provided on a first come, first served basis, please arrive early. Contact information: [address] [place]  [address] [phone]        [facility] Follow up.   Specialty: Professional Counselor Why: This is another option for therapy.  Please go within 7 days of hospital discharge to start in services.  Walk-In hours are available Monday-Friday 8:30am-12:00pm and 1:00pm-2:30pm. Contact information: [facility] [place] [address] [place] [address] [phone]                 Follow-up recommendations:  - Activity as tolerated. - Diet as recommended by PCP. - Keep all scheduled follow-up appointments as recommended.  Comments: Patient is instructed to take all prescribed medications as recommended. Report any side effects or adverse reactions to your outpatient psychiatrist. Patient is instructed to abstain from alcohol and illegal drugs while on prescription medications. In the event of worsening symptoms, patient is instructed to call the crisis hotline, 911, or go to the nearest emergency department for evaluation and treatment.  Signed: Lauro Franklin, MD 12/01/2020, 9:50 AM

## 2020-12-01 NOTE — Progress Notes (Signed)
D:  Patient's self inventory sheet, patient sleeps good, sleep medication helpful.  Good appetite, high energy level, good concentration.  Denied depression, anxiety and hopeless.  Denied withdrawals.  Denied SI.  Denied physical problems.  Denied pain.  Goal is to work on his discharge plan.  Talk to SW.  Does have discharge plans. A:  Medications administered per MD orders.  Emotional support and encouragement given patient. R:  Denied SI and HI, contracts for safety.  Denied A/V hallucinations.  Safety maintained with 15 minute checks.

## 2020-12-01 NOTE — BHH Suicide Risk Assessment (Signed)
Suicide Risk Assessment  Discharge Assessment    [facility] Discharge Suicide Risk Assessment   Principal Problem: Acute adjustment disorder with mixed anxiety and depressed mood Discharge Diagnoses: Principal Problem:   Acute adjustment disorder with mixed anxiety and depressed mood   Total Time spent with patient: 45 minutes  Musculoskeletal: Strength & Muscle Tone: within normal limits Gait & Station: normal Patient leans: N/A  Psychiatric Specialty Exam  Presentation  General Appearance: Appropriate for Environment; Casual; Fairly Groomed  Eye Contact:Good  Speech:Clear and Coherent; Normal Rate  Speech Volume:Normal  Handedness:Right   Mood and Affect  Mood:Euthymic  Duration of Depression Symptoms: Greater than two weeks  Affect:Congruent; Appropriate   Thought Process  Thought Processes:Coherent; Goal Directed  Descriptions of Associations:Intact  Orientation:Full (Time, Place and Person)  Thought Content:Logical; WDL  History of Schizophrenia/Schizoaffective disorder:No  Duration of Psychotic Symptoms:No data recorded Hallucinations:Hallucinations: None  Ideas of Reference:None  Suicidal Thoughts:Suicidal Thoughts: No  Homicidal Thoughts:Homicidal Thoughts: No   Sensorium  Memory:Immediate Good; Recent Good  Judgment:Fair  Insight:Fair   Executive Functions  Concentration:Good  Attention Span:Good  Recall:Good  Fund of Knowledge:Good  Language:Good   Psychomotor Activity  Psychomotor Activity:Psychomotor Activity: Normal   Assets  Assets:Communication Skills; Physical Health; Resilience   Sleep  Sleep:Sleep: Good Number of Hours of Sleep: 6.5   Physical Exam: Physical Exam ROS Blood pressure 120/71, pulse (!) 108, temperature 97.8 F (36.6 C), temperature source Oral, resp. rate 18, height 5\' 10"  (1.778 m), weight 100.4 kg, SpO2 100 %. Body mass index is 31.77 kg/m.  Mental Status Per Nursing Assessment::   On  Admission:  NA  Demographic Factors:  Male  Loss Factors: NA  Historical Factors: Impulsivity  Risk Reduction Factors:   Sense of responsibility to family, Religious beliefs about death, Employed, Living with another person, especially a relative, and Positive social support  Continued Clinical Symptoms:  None  Cognitive Features That Contribute To Risk:  Thought constriction (tunnel vision)    Suicide Risk:  Minimal: No identifiable suicidal ideation.  Patients presenting with no risk factors but with morbid ruminations; may be classified as minimal risk based on the severity of the depressive symptoms   Follow-up Information     [facility] Follow up.   Specialty: Urgent Care Why: This is one option for therapy.  Please go within 7 days of hospital discharge to start in services.  Walk-In hours are available Monday-Wednesday 8am-11am or Friday 12pm-4pm.  Services are provided on a first come, first served basis, please arrive early. Contact information: [address] [place] [phone]        [facility] Follow up.   Specialty: Professional Counselor Why: This is another option for therapy.  Please go within 7 days of hospital discharge to start in services.  Walk-In hours are available Monday-Friday 8:30am-12:00pm and 1:00pm-2:30pm. Contact information: [facility] [place] [address] [place] [phone]                 Plan Of Care/Follow-up recommendations:  - Activity as tolerated. - Diet as recommended by PCP. - Keep all scheduled follow-up appointments as recommended.   [phone], MD 12/01/2020, 9:51 AM

## 2020-12-01 NOTE — Progress Notes (Signed)
Discharge Note:  Patient discharged to grandparents' home in [place] with two bus tickets.  Suicide prevention information given and discussed with patient who stated he understood and had no questions.  Patient stated he received all his belongings, clothing, toiletries, misc items, etc.  Patient stated he appreciated all assistance received.  All required discharge information given.  Patient denied SI and HI.  Denied A/V hallucinations.

## 2020-12-01 NOTE — Group Note (Signed)
[facility] LCSW Group Therapy Note  Date/Time:  12/01/2020 11:00am-11:30am  Type of Therapy and Topic:  Group Therapy:  Healthy and Unhealthy Supports  Participation Level:  Active   Description of Group:  Patients in this group were led in a discussion about the differences between healthy and unhealthy supports.  They were introduced to the idea of adding a variety of healthy supports to address the various needs in their lives, as well as the idea that unhealthy supports need to be eliminated from their lives through the use of appropriate boundary setting.  A song entitled, "My Own Hero" was played and then discussed.  Patients acknowledged as a whole that it is important to be involved in one's own recovery journey, and that by doing so, they are acting as a hero to themselves.  Therapeutic Goals:   1)  discuss the difference between healthy and unhealthy supports  2)  describe the importance of adding healthy supports to stay well once out of the hospital  3)  discuss options for how to address unhealthy supports  5)  encourage active participation in one's own recovery plan    Summary of Patient Progress:  The patient stated that current healthy supports in his life are his therapist that he hopes to get while out of the hospital, his best friend of 18 years, and his mother who is soothing to him while current unhealthy supports include a few acquaintances he needs to cut off.  The patient expressed a willingness to add self-support to help in his recovery journey.   Therapeutic Modalities:   Motivational Interviewing Brief Solution-Focused Therapy  Ambrose Mantle, LCSW

## 2020-12-01 NOTE — BHH Suicide Risk Assessment (Addendum)
[facility] INPATIENT:  Family/Significant Other Suicide Prevention Education  Suicide Prevention Education:  Contact Attempts: 2, Vesta Mixer [phone] Scarlette Calico Volkert [phone] (Mother)) has been identified by the patient as the family member/significant other with whom the patient will be residing, and identified as the person(s) who will aid the patient in the event of a mental health crisis.  With written consent from the patient, two attempts were made to provide suicide prevention education, prior to and/or following the patient's discharge.  We were unsuccessful in providing suicide prevention education.  A suicide education pamphlet was given to the patient to share with family/significant other.  Date and time of first attempt:12/01/2020 /10:07 AM No voicemail could be left due to inbox being full. Date and time of second attempt:12/01/2020 /10:07 AM HIPAA compliant voicemail left asking for a call back.  Carloyn Jaeger Grossman-Orr 12/01/2020, 10:05 AM

## 2020-12-01 NOTE — Progress Notes (Signed)
  [facility] Adult Case Management Discharge Plan :  Will you be returning to the same living situation after discharge:  Yes,  returning to his own living situation. At discharge, do you have transportation home?: Yes,  grandmother is picking them up at 12:00pm.   Do you have the ability to pay for your medications: No. Pt has no insurance, but is planning on getting insurance next month with a new job.  Release of information consent forms completed and emailed to Medical Records, then turned in to Medical Records by CSW.  Patient to Follow up at:  Follow-up Information     [facility] Follow up.   Specialty: Urgent Care Why: This is one option for therapy.  Please go within 7 days of hospital discharge to start in services.  Walk-In hours are available Monday-Wednesday 8am-11am or Friday 12pm-4pm. Contact information: [address] [place] [address] [phone]        [facility] Follow up.   Specialty: Professional Counselor Why: This is another option for therapy.  Please go within 7 days of hospital discharge to start in services.  Walk-In hours are available Monday-Friday 8:30am-12:00pm and 1:00pm-2:30pm. Contact information: [facility] [place] [address] [place] [address] [phone]                 Next level of care provider has access to [facility] Link:no  Safety Planning and Suicide Prevention discussed: No. 2 attempts to contact mother and grandmother were made. Suicide prevention information provided at discharge with instructions to share with family members.     Has patient been referred to the Quitline?: Patient refused referral  Patient has been referred for addiction treatment: N/A  Lynnell Chad, LCSW 12/01/2020, 8:52 AM

## 2020-12-01 NOTE — Plan of Care (Signed)
Nurse discussed anxiety and copoing skills with patient.

## 2020-12-05 ENCOUNTER — Encounter (HOSPITAL_COMMUNITY): Payer: Self-pay | Admitting: Emergency Medicine

## 2020-12-05 ENCOUNTER — Other Ambulatory Visit: Payer: Self-pay

## 2020-12-05 ENCOUNTER — Emergency Department (HOSPITAL_COMMUNITY): Payer: Self-pay

## 2020-12-05 ENCOUNTER — Emergency Department (HOSPITAL_COMMUNITY)
Admission: EM | Admit: 2020-12-05 | Discharge: 2020-12-05 | Disposition: A | Discharge status: Home / Self Care | Payer: Self-pay | Attending: Emergency Medicine | Admitting: Emergency Medicine

## 2020-12-05 DIAGNOSIS — R0602 Shortness of breath: Secondary | ICD-10-CM | POA: Insufficient documentation

## 2020-12-05 DIAGNOSIS — R109 Unspecified abdominal pain: Secondary | ICD-10-CM | POA: Insufficient documentation

## 2020-12-05 HISTORY — DX: Suicidal ideations: R45.851

## 2020-12-05 MED ORDER — VALACYCLOVIR HCL 1 G PO TABS: 1000.0000 mg | ORAL_TABLET | Freq: Three times a day (TID) | ORAL | 0 refills | Status: DC

## 2020-12-05 MED ORDER — KETOROLAC TROMETHAMINE 15 MG/ML IJ SOLN
15.0000 mg | Freq: Once | INTRAMUSCULAR | Status: AC
  Administered 2020-12-05: 15 mg via INTRAVENOUS
  Filled 2020-12-05: qty 1

## 2020-12-05 MED ORDER — OXYCODONE-ACETAMINOPHEN 5-325 MG PO TABS: 1.0000 | ORAL_TABLET | Freq: Once | ORAL | Status: DC

## 2020-12-05 MED ORDER — VALACYCLOVIR HCL 500 MG PO TABS
1000.0000 mg | ORAL_TABLET | Freq: Once | ORAL | Status: AC
  Administered 2020-12-05: 1000 mg via ORAL
  Filled 2020-12-05: qty 2

## 2020-12-05 MED ORDER — NAPROXEN 500 MG PO TABS: ORAL_TABLET | ORAL | 0 refills | Status: DC

## 2020-12-05 NOTE — ED Triage Notes (Addendum)
Patient BIB [facility]  , c/o right sided flank pain, shobr, started about two days ago. Pain rated 9/10.   Patient states he has right lower back pain and shobr with inhalation.

## 2020-12-05 NOTE — ED Provider Notes (Signed)
[facility] DEPT Provider Note: [name], MD, FACEP  CSN: [id number] MRN: [id number] ARRIVAL: 12/05/20 at [place]  Flank Pain   HISTORY OF PRESENT ILLNESS  12/05/20 3:26 AM Eugene Steele is a 30 y.o. male with 2 days of right flank pain.  The pain is located in the right lower rib area and radiates around to his back.  There is no associated rash.  The pain is worse with deep breathing or with palpation of those ribs.  He denies any trauma.  He has not had dysuria or hematuria.  He feels short of breath due to the pain with breathing.  He rates his pain as a 9 out of 10.  It is primarily sharp in nature.   Past Medical History:  Diagnosis Date   Suicidal ideation     History reviewed. No pertinent surgical history.  History reviewed. No pertinent family history.  Social History   Tobacco Use   Smoking status: Never   Smokeless tobacco: Never  Substance Use Topics   Alcohol use: Never   Drug use: Never    Prior to Admission medications   Medication Sig Start Date End Date Taking? Authorizing Provider  naproxen (NAPROSYN) 500 MG tablet Take 1 tablet twice daily for chest wall pain. 12/05/20  Yes Eliam Snapp, MD  valACYclovir (VALTREX) 1000 MG tablet Take 1 tablet (1,000 mg total) by mouth 3 (three) times daily. 12/05/20  Yes Lashawndra Lampkins, MD  hydrOXYzine (ATARAX/VISTARIL) 25 MG tablet Take 1 tablet (25 mg total) by mouth 3 (three) times daily as needed for anxiety. 12/01/20   Briant Cedar, MD    Allergies Patient has no known allergies.   REVIEW OF SYSTEMS  Negative except as noted here or in the History of Present Illness.   PHYSICAL EXAMINATION  Initial Vital Signs Blood pressure 129/79, pulse 71, temperature 98.3 F (36.8 C), temperature source Oral, resp. rate 18, height 5\' 10"  (1.778 m), weight 99.3 kg, SpO2 100 %.  Examination General: Well-developed, well-nourished male in no acute distress;  appearance consistent with age of record HENT: normocephalic; atraumatic Eyes: Normal appearance Neck: supple Heart: regular rate and rhythm Lungs: clear to auscultation bilaterally; shallow breaths Chest: Right lower lateral rib tenderness without rash or hyperesthesia Abdomen: soft; nondistended; nontender; bowel sounds present Extremities: No deformity; full range of motion; pulses normal; no edema Neurologic: Awake, alert and oriented; motor function intact in all extremities and symmetric; no facial droop Skin: Warm and dry Psychiatric: Normal mood and affect   RESULTS  Summary of this visit's results, reviewed and interpreted by myself:   EKG Interpretation  Date/Time:    Ventricular Rate:    PR Interval:    QRS Duration:   QT Interval:    QTC Calculation:   R Axis:     Text Interpretation:         Laboratory Studies: No results found for this or any previous visit (from the past 24 hour(s)). Imaging Studies: DG Chest 2 View  Result Date: 12/05/2020 CLINICAL DATA:  Right pleuritic pain EXAM: CHEST - 2 VIEW COMPARISON:  11/25/2020 FINDINGS: Normal heart size and mediastinal contours. No acute infiltrate or edema. No effusion or pneumothorax. No acute osseous findings. IMPRESSION: Negative chest. Electronically Signed   By: Jorje Guild M.D.   On: 12/05/2020 04:04   CT Renal Stone Study  Result Date: 12/05/2020 CLINICAL DATA:  Flank pain.  Concern for kidney stone. EXAM: CT ABDOMEN AND PELVIS  WITHOUT CONTRAST TECHNIQUE: Multidetector CT imaging of the abdomen and pelvis was performed following the standard protocol without IV contrast. COMPARISON:  CT dated 02/27/2020. FINDINGS: Evaluation of this exam is limited in the absence of intravenous contrast. Lower chest: The visualized lung bases are clear. No intra-abdominal free air or free fluid. Hepatobiliary: Fatty liver. No intrahepatic biliary dilatation. The gallbladder is unremarkable. Pancreas: Unremarkable. No  pancreatic ductal dilatation or surrounding inflammatory changes. Spleen: Normal in size without focal abnormality. Adrenals/Urinary Tract: The adrenal glands unremarkable. The kidneys, visualized ureters, and urinary bladder appear unremarkable. Stomach/Bowel: There is no bowel obstruction or active inflammation. The appendix is normal. Vascular/Lymphatic: The abdominal aorta and IVC unremarkable. No portal venous gas. There is no adenopathy. Reproductive: The prostate and seminal vesicles are grossly unremarkable. No pelvic mass. Other: None Musculoskeletal: No acute or significant osseous findings. IMPRESSION: 1. No acute intra-abdominal or pelvic pathology. No hydronephrosis or nephrolithiasis. 2. Fatty liver. Electronically Signed   By: Elgie Collard M.D.   On: 12/05/2020 03:19    ED COURSE and MDM  Nursing notes, initial and subsequent vitals signs, including pulse oximetry, reviewed and interpreted by myself.  Vitals:   12/05/20 0233 12/05/20 0234 12/05/20 0340  BP:  129/79 128/76  Pulse:  71 70  Resp:  18 17  Temp:  98.3 F (36.8 C)   TempSrc:  Oral   SpO2:  100% 100%  Weight: 99.3 kg    Height: 5\' 10"  (1.778 m)     Medications  valACYclovir (VALTREX) tablet 1,000 mg (has no administration in time range)  ketorolac (TORADOL) 15 MG/ML injection 15 mg (15 mg Intravenous Given 12/05/20 0339)    Patient has no risk factors for pulmonary embolism.  He is not tachycardic and his oxygen saturation is 100% on room air.  He does not have hyperesthesia or rash to suggest shingles, although shingles can involve visceral nerves as well as somatic nerves.    4:06 AM Pain improved with IV Toradol.  Will avoid narcotics due to patient's history of substance abuse.  I suspect a musculoskeletal origin but given the dermatomal pattern I cannot exclude shingles.  We will start him on Valtrex.  PROCEDURES  Procedures   ED DIAGNOSES     ICD-10-CM   1. Right flank pain  R10.9           Che Below, 12/07/20, MD 12/05/20 [phone]

## 2020-12-05 NOTE — ED Notes (Signed)
Patient notified of need for urine sample. Patient stated "I cannot do it right now. I used the bathroom before EMS picked me up"

## 2021-01-28 ENCOUNTER — Emergency Department (HOSPITAL_COMMUNITY)
Admission: EM | Admit: 2021-01-28 | Discharge: 2021-01-29 | Disposition: A | Discharge status: Home / Self Care | Payer: Self-pay | Attending: Emergency Medicine | Admitting: Emergency Medicine

## 2021-01-28 DIAGNOSIS — S39011A Strain of muscle, fascia and tendon of abdomen, initial encounter: Secondary | ICD-10-CM | POA: Insufficient documentation

## 2021-01-28 DIAGNOSIS — R0602 Shortness of breath: Secondary | ICD-10-CM | POA: Insufficient documentation

## 2021-01-28 DIAGNOSIS — X58XXXA Exposure to other specified factors, initial encounter: Secondary | ICD-10-CM | POA: Insufficient documentation

## 2021-01-28 DIAGNOSIS — Z20822 Contact with and (suspected) exposure to covid-19: Secondary | ICD-10-CM | POA: Insufficient documentation

## 2021-01-28 DIAGNOSIS — R079 Chest pain, unspecified: Secondary | ICD-10-CM | POA: Insufficient documentation

## 2021-01-28 DIAGNOSIS — R058 Other specified cough: Secondary | ICD-10-CM | POA: Insufficient documentation

## 2021-01-28 DIAGNOSIS — T148XXA Other injury of unspecified body region, initial encounter: Secondary | ICD-10-CM

## 2021-01-28 DIAGNOSIS — R109 Unspecified abdominal pain: Secondary | ICD-10-CM

## 2021-01-28 NOTE — ED Provider Triage Note (Signed)
Emergency Medicine Provider Triage Evaluation Note  Parmvir Boomer , a 31 y.o. male  was evaluated in triage.  Pt complains of rattling in the chest, feels somewhat SOB.  Rhonchi on right per EMS.  Coughing up yellow mucous recently.  No recent sick contacts.    Review of Systems  Positive: Cough, SOB Negative: fever  Physical Exam  BP (!) 147/115    Pulse 87    Temp 98.3 F (36.8 C) (Oral)    Resp 16    SpO2 99%   Gen:   Awake, no distress   Resp:  Normal effort  MSK:   Moves extremities without difficulty  Other:    Medical Decision Making  Medically screening exam initiated at 11:53 PM.  Appropriate orders placed.  Gunther Zawadzki was informed that the remainder of the evaluation will be completed by another provider, this initial triage assessment does not replace that evaluation, and the importance of remaining in the ED until their evaluation is complete.  Right chest pain, cough.  EKG, labs, CXR, covid/flu screen.   Garlon Hatchet, PA-C 01/29/21 0002

## 2021-01-29 ENCOUNTER — Other Ambulatory Visit: Payer: Self-pay

## 2021-01-29 ENCOUNTER — Emergency Department (HOSPITAL_COMMUNITY): Payer: Self-pay

## 2021-01-29 LAB — CBC WITH DIFFERENTIAL/PLATELET
Abs Immature Granulocytes: 0.05 10*3/uL (ref 0.00–0.07)
Basophils Absolute: 0.1 10*3/uL (ref 0.0–0.1)
Basophils Relative: 1 %
Eosinophils Absolute: 0.3 10*3/uL (ref 0.0–0.5)
Eosinophils Relative: 3 %
HCT: 47.8 % (ref 39.0–52.0)
Hemoglobin: 16.1 g/dL (ref 13.0–17.0)
Immature Granulocytes: 0 %
Lymphocytes Relative: 24 %
Lymphs Abs: 3.1 10*3/uL (ref 0.7–4.0)
MCH: 28.6 pg (ref 26.0–34.0)
MCHC: 33.7 g/dL (ref 30.0–36.0)
MCV: 85.1 fL (ref 80.0–100.0)
Monocytes Absolute: 0.8 10*3/uL (ref 0.1–1.0)
Monocytes Relative: 6 %
Neutro Abs: 8.7 10*3/uL — ABNORMAL HIGH (ref 1.7–7.7)
Neutrophils Relative %: 66 %
Platelets: 337 10*3/uL (ref 150–400)
RBC: 5.62 MIL/uL (ref 4.22–5.81)
RDW: 13.5 % (ref 11.5–15.5)
WBC: 13 10*3/uL — ABNORMAL HIGH (ref 4.0–10.5)
nRBC: 0 % (ref 0.0–0.2)

## 2021-01-29 LAB — BASIC METABOLIC PANEL
Anion gap: 12 (ref 5–15)
BUN: 9 mg/dL (ref 6–20)
CO2: 23 mmol/L (ref 22–32)
Calcium: 9.5 mg/dL (ref 8.9–10.3)
Chloride: 103 mmol/L (ref 98–111)
Creatinine, Ser: 1.12 mg/dL (ref 0.61–1.24)
GFR, Estimated: >60 mL/min (ref >60)
Glucose, Bld: 93 mg/dL (ref 70–99)
Potassium: 3.6 mmol/L (ref 3.5–5.1)
Sodium: 138 mmol/L (ref 135–145)

## 2021-01-29 LAB — RESP PANEL BY RT-PCR (FLU A&B, COVID) ARPGX2
Influenza A by PCR: NEGATIVE
Influenza B by PCR: NEGATIVE
SARS Coronavirus 2 by RT PCR: NEGATIVE

## 2021-01-29 LAB — TROPONIN I (HIGH SENSITIVITY)
Troponin I (High Sensitivity): 6 ng/L (ref <18)
Troponin I (High Sensitivity): 6 ng/L (ref <18)

## 2021-01-29 MED ORDER — KETOROLAC TROMETHAMINE 60 MG/2ML IM SOLN
30.0000 mg | Freq: Once | INTRAMUSCULAR | Status: AC
  Administered 2021-01-29: 30 mg via INTRAMUSCULAR
  Filled 2021-01-29: qty 2

## 2021-01-29 MED ORDER — ACETAMINOPHEN 500 MG PO TABS
1000.0000 mg | ORAL_TABLET | Freq: Once | ORAL | Status: AC
Start: 2021-01-29 — End: 2021-01-29
  Administered 2021-01-29: 1000 mg via ORAL
  Filled 2021-01-29: qty 2

## 2021-01-29 NOTE — ED Notes (Signed)
Pt called for vitals, no response. 

## 2021-01-29 NOTE — ED Triage Notes (Signed)
Pt ambulatory to ED triage via [facility] with c/o ShOB that increases with deep breathing x1 day.Pt denies hx of asthma or other lung problems.Reports coughing up phlegm.

## 2021-01-29 NOTE — ED Notes (Signed)
Pt reporting 8/10 flank pain with sharp pains in his chest when he breaths deeply

## 2021-01-29 NOTE — Discharge Instructions (Addendum)
You may use over-the-counter Motrin (Ibuprofen), Acetaminophen (Tylenol), topical muscle creams such as SalonPas, Icy Hot, Bengay, etc. Please stretch, apply ice or heat (whichever helps), and have massage therapy for additional assistance.  

## 2021-01-29 NOTE — ED Provider Notes (Signed)
[facility] EMERGENCY DEPARTMENT Provider Note  CSN: [id number] Arrival date & time: 01/28/21 2354  Chief Complaint(s) Shortness of Breath and Flank Pain  HPI Eugene Steele is a 31 y.o. male   The history is provided by the patient.  Flank Pain This is a recurrent problem. The current episode started 2 days ago. The problem occurs constantly. The problem has not changed since onset.Associated symptoms include chest pain (intermittent) and shortness of breath (due to pain). Pertinent negatives include no abdominal pain and no headaches. The symptoms are aggravated by twisting, coughing and bending. The symptoms are relieved by NSAIDs. Treatments tried: motrin. The treatment provided moderate relief.   Past Medical History Past Medical History:  Diagnosis Date   Suicidal ideation    Patient Active Problem List   Diagnosis Date Noted   Acute adjustment disorder with mixed anxiety and depressed mood 11/26/2020   Suicidal ideation 11/26/2020   Home Medication(s) Prior to Admission medications   Medication Sig Start Date End Date Taking? Authorizing Provider  hydrOXYzine (ATARAX/VISTARIL) 25 MG tablet Take 1 tablet (25 mg total) by mouth 3 (three) times daily as needed for anxiety. 12/01/20   Lauro Franklin, MD  naproxen (NAPROSYN) 500 MG tablet Take 1 tablet twice daily for chest wall pain. 12/05/20   Molpus, John, MD  valACYclovir (VALTREX) 1000 MG tablet Take 1 tablet (1,000 mg total) by mouth 3 (three) times daily. 12/05/20   Molpus, Jonny Ruiz, MD                                                                                                                                    Allergies Patient has no known allergies.  Review of Systems Review of Systems  Respiratory:  Positive for shortness of breath (due to pain).   Cardiovascular:  Positive for chest pain (intermittent).  Gastrointestinal:  Negative for abdominal pain.  Genitourinary:  Positive for  flank pain. Negative for difficulty urinating, dysuria and hematuria.  Neurological:  Negative for headaches.  As noted in HPI  Physical Exam Vital Signs  I have reviewed the triage vital signs BP 111/83    Pulse 62    Temp 98.6 F (37 C) (Oral)    Resp 18    SpO2 100%   Physical Exam Vitals reviewed.  Constitutional:      General: He is not in acute distress.    Appearance: He is well-developed. He is not diaphoretic.  HENT:     Head: Normocephalic and atraumatic.     Nose: Nose normal.  Eyes:     General: No scleral icterus.       Right eye: No discharge.        Left eye: No discharge.     Conjunctiva/sclera: Conjunctivae normal.     Pupils: Pupils are equal, round, and reactive to light.  Cardiovascular:     Rate and Rhythm: Normal rate and regular rhythm.  Heart sounds: No murmur heard.   No friction rub. No gallop.  Pulmonary:     Effort: Pulmonary effort is normal. No respiratory distress.     Breath sounds: Normal breath sounds. No stridor. No rales.  Abdominal:     General: There is no distension.     Palpations: Abdomen is soft.     Tenderness: There is no abdominal tenderness.  Musculoskeletal:     Cervical back: Normal range of motion and neck supple.     Thoracic back: Spasms and tenderness present. No bony tenderness.       Back:  Skin:    General: Skin is warm and dry.     Findings: No erythema or rash.  Neurological:     Mental Status: He is alert and oriented to person, place, and time.    ED Results and Treatments Labs (all labs ordered are listed, but only abnormal results are displayed) Labs Reviewed  CBC WITH DIFFERENTIAL/PLATELET - Abnormal; Notable for the following components:      Result Value   WBC 13.0 (*)    Neutro Abs 8.7 (*)    All other components within normal limits  RESP PANEL BY RT-PCR (FLU A&B, COVID) ARPGX2  BASIC METABOLIC PANEL  TROPONIN I (HIGH SENSITIVITY)  TROPONIN I (HIGH SENSITIVITY)                                                                                                                          EKG  EKG Interpretation  Date/Time:  Wednesday January 29 2021 00:37:03 EST Ventricular Rate:  72 PR Interval:  136 QRS Duration: 82 QT Interval:  356 QTC Calculation: 389 R Axis:   76 Text Interpretation: Normal sinus rhythm Possible Left atrial enlargement Borderline ECG When compared with ECG of 25-Nov-2020 00:42, PREVIOUS ECG IS PRESENT Confirmed by Addison Lank [phone]) on 01/29/2021 4:53:01 AM       Radiology DG Chest 2 View  Result Date: 01/29/2021 CLINICAL DATA:  Chest pain EXAM: CHEST - 2 VIEW COMPARISON:  12/05/2020 FINDINGS: The heart size and mediastinal contours are within normal limits. Both lungs are clear. The visualized skeletal structures are unremarkable. IMPRESSION: No active cardiopulmonary disease. Electronically Signed   By: Inez Catalina M.D.   On: 01/29/2021 02:06    Pertinent labs & imaging results that were available during my care of the patient were reviewed by me and considered in my medical decision making (see MDM for details).  Medications Ordered in ED Medications  ketorolac (TORADOL) injection 30 mg (30 mg Intramuscular Given 01/29/21 0524)  acetaminophen (TYLENOL) tablet 1,000 mg (1,000 mg Oral Given 01/29/21 0524)  Procedures Procedures  (including critical care time)  Medical Decision Making / ED Course        Right flank pain Tender to palpation in the right paraspinal musculature. No evidence of rash, erythema, or soft tissue infection. No abdominal pain or tenderness. No urinary symptoms concerning for UTI or urinary colic. Patient also endorsing intermittent pleuritic chest pain and coughing. Low suspicion for pulmonary embolism as patient is not tachycardic or hypoxic.  He is PERC negative. Presentation not classic  pleuritic dissection or esophageal perforation. Doubt ACS but will obtain cardiac work-up.  Heart score less than 3.  Appropriate for delta troponin Will assess for pneumonia   Work-up ordered to assess concerns above.  Labs and imaging independently interpreted by me and noted below: EKG without acute ischemic changes or evidence of pericarditis. Troponins negative x2. CBC is notable for leukocytosis.  No anemia. Metabolic panel without significant electrolyte derangement or renal sufficiency. COVID/influenza negative. On my read of the chest x-ray, there was no evidence suggestive of pneumonia, pneumothorax, pneumomediastinum, pulmonary edema concerning for new or exacerbation of heart failure, abnormal contour of the mediastinum to suggest dissection, and no evidence of acute injuries.   Management: Treated with IM Toradol and oral Tylenol  Reassessment: Reported improvement        Assessment/Plan:                                                                                                                                              Flank pain Most consistent with muscular strain/spasm. Patient is not having any urinary symptoms concerning for pyelonephritis or renal colic. Doubt vascular process. Intermittent pleuritic chest pain. Ruled out for ACS, PE, dissection.  No pneumonia noted. Patient does not require admission to the hospital for continued cardiac work-up Possible viral process given leukocytosis. Supportive management.  Final Clinical Impression(s) / ED Diagnoses Final diagnoses:  Right flank pain  Muscle strain  Other cough   The patient appears reasonably screened and/or stabilized for discharge and I doubt any other medical condition or other [facility] requiring further screening, evaluation, or treatment in the ED at this time prior to discharge. Safe for discharge with strict return precautions.  Disposition: Discharge  Condition: Good  I have discussed  the results, Dx and Tx plan with the patient/family who expressed understanding and agree(s) with the plan. Discharge instructions discussed at length. The patient/family was given strict return precautions who verbalized understanding of the instructions. No further questions at time of discharge.    ED Discharge Orders     None               This chart was dictated using voice recognition software.  Despite best efforts to proofread,  errors can occur which can change the documentation meaning.    Fatima Blank, MD 01/29/21 [phone]

## 2021-01-29 NOTE — ED Notes (Signed)
Pt verbalized understanding of d/c instructions, meds and followup care. Denies questions. VSS, no distress noted. Steady gait to exit with all belongings.  ?

## 2021-02-01 ENCOUNTER — Other Ambulatory Visit: Payer: Self-pay

## 2021-02-01 ENCOUNTER — Emergency Department (HOSPITAL_COMMUNITY)
Admission: EM | Admit: 2021-02-01 | Discharge: 2021-02-02 | Disposition: A | Discharge status: Home / Self Care | Payer: Self-pay | Attending: Emergency Medicine | Admitting: Emergency Medicine

## 2021-02-01 ENCOUNTER — Emergency Department (HOSPITAL_COMMUNITY): Payer: Self-pay

## 2021-02-01 ENCOUNTER — Encounter (HOSPITAL_COMMUNITY): Payer: Self-pay | Admitting: Emergency Medicine

## 2021-02-01 DIAGNOSIS — D72829 Elevated white blood cell count, unspecified: Secondary | ICD-10-CM | POA: Insufficient documentation

## 2021-02-01 DIAGNOSIS — R519 Headache, unspecified: Secondary | ICD-10-CM

## 2021-02-01 DIAGNOSIS — G43909 Migraine, unspecified, not intractable, without status migrainosus: Secondary | ICD-10-CM | POA: Insufficient documentation

## 2021-02-01 MED ORDER — ONDANSETRON 4 MG PO TBDP
4.0000 mg | ORAL_TABLET | Freq: Once | ORAL | Status: AC
  Administered 2021-02-01: 4 mg via ORAL
  Filled 2021-02-01: qty 1

## 2021-02-01 MED ORDER — IBUPROFEN 400 MG PO TABS
600.0000 mg | ORAL_TABLET | Freq: Once | ORAL | Status: AC
  Administered 2021-02-01: 600 mg via ORAL
  Filled 2021-02-01: qty 1

## 2021-02-01 NOTE — ED Provider Triage Note (Signed)
Emergency Medicine Provider Triage Evaluation Note  Eugene Steele , a 31 y.o. male  was evaluated in triage.  Pt complains of headache, started this morning, feels a throbbing sensation in the front and side of his head, is remained consistent, has associated photophobia, increased sensitive to noise, nausea and vomiting, denies any change in vision, paresthesias or weakness upper lower extremities, no recent head trauma, not on anticoagulant, states he is had 1 migraine in the past but this was over 12 years ago, he states this feels worse than the last migraine that he had, never had any imaging..  Review of Systems  Positive: Headaches, photophobia Negative: Paresthesias, weakness  Physical Exam  BP (!) 129/92 (BP Location: Right Arm)    Pulse 72    Temp 98.5 F (36.9 C) (Oral)    Resp 16    SpO2 100%  Gen:   Awake, no distress   Resp:  Normal effort  MSK:   Moves extremities without difficulty  Other:  No facial asymmetry, no difficult word finding, able follow two-step commands, no unilateral weakness present.  Medical Decision Making  Medically screening exam initiated at 11:16 PM.  Appropriate orders placed.  Eugene Steele was informed that the remainder of the evaluation will be completed by another provider, this initial triage assessment does not replace that evaluation, and the importance of remaining in the ED until their evaluation is complete.  Presents with a migraine, laboratory have been ordered, patient need further work-up.   Eugene Sage, PA-C 02/01/21 2317

## 2021-02-01 NOTE — ED Triage Notes (Signed)
Pt BIB [facility] from home, c/o migraine with nausea/vomiting, and light sensitivity.

## 2021-02-02 LAB — CBC WITH DIFFERENTIAL/PLATELET
Abs Immature Granulocytes: 0.04 10*3/uL (ref 0.00–0.07)
Basophils Absolute: 0.1 10*3/uL (ref 0.0–0.1)
Basophils Relative: 1 %
Eosinophils Absolute: 0.3 10*3/uL (ref 0.0–0.5)
Eosinophils Relative: 2 %
HCT: 49.2 % (ref 39.0–52.0)
Hemoglobin: 16.7 g/dL (ref 13.0–17.0)
Immature Granulocytes: 0 %
Lymphocytes Relative: 29 %
Lymphs Abs: 3.4 10*3/uL (ref 0.7–4.0)
MCH: 29.1 pg (ref 26.0–34.0)
MCHC: 33.9 g/dL (ref 30.0–36.0)
MCV: 85.7 fL (ref 80.0–100.0)
Monocytes Absolute: 0.9 10*3/uL (ref 0.1–1.0)
Monocytes Relative: 8 %
Neutro Abs: 7.1 10*3/uL (ref 1.7–7.7)
Neutrophils Relative %: 60 %
Platelets: 328 10*3/uL (ref 150–400)
RBC: 5.74 MIL/uL (ref 4.22–5.81)
RDW: 13.4 % (ref 11.5–15.5)
WBC: 11.8 10*3/uL — ABNORMAL HIGH (ref 4.0–10.5)
nRBC: 0 % (ref 0.0–0.2)

## 2021-02-02 LAB — BASIC METABOLIC PANEL
Anion gap: 12 (ref 5–15)
BUN: 19 mg/dL (ref 6–20)
CO2: 23 mmol/L (ref 22–32)
Calcium: 9.7 mg/dL (ref 8.9–10.3)
Chloride: 103 mmol/L (ref 98–111)
Creatinine, Ser: 1.28 mg/dL — ABNORMAL HIGH (ref 0.61–1.24)
GFR, Estimated: >60 mL/min (ref >60)
Glucose, Bld: 110 mg/dL — ABNORMAL HIGH (ref 70–99)
Potassium: 4.3 mmol/L (ref 3.5–5.1)
Sodium: 138 mmol/L (ref 135–145)

## 2021-02-02 MED ORDER — DIPHENHYDRAMINE HCL 25 MG PO CAPS
25.0000 mg | ORAL_CAPSULE | Freq: Once | ORAL | Status: AC
Start: 2021-02-02 — End: 2021-02-02
  Administered 2021-02-02: 25 mg via ORAL
  Filled 2021-02-02: qty 1

## 2021-02-02 MED ORDER — PROCHLORPERAZINE MALEATE 10 MG PO TABS
10.0000 mg | ORAL_TABLET | Freq: Once | ORAL | Status: AC
  Administered 2021-02-02: 10 mg via ORAL
  Filled 2021-02-02: qty 1

## 2021-02-02 MED ORDER — KETOROLAC TROMETHAMINE 15 MG/ML IJ SOLN
15.0000 mg | Freq: Once | INTRAMUSCULAR | Status: AC
  Administered 2021-02-02: 15 mg via INTRAVENOUS
  Filled 2021-02-02: qty 1

## 2021-02-02 MED ORDER — IBUPROFEN 600 MG PO TABS: 600.0000 mg | ORAL_TABLET | Freq: Four times a day (QID) | ORAL | 0 refills | Status: DC | PRN

## 2021-02-02 MED ORDER — ONDANSETRON HCL 4 MG PO TABS: 4.0000 mg | ORAL_TABLET | Freq: Four times a day (QID) | ORAL | 0 refills | Status: DC

## 2021-02-02 MED ORDER — LACTATED RINGERS IV BOLUS
1000.0000 mL | Freq: Once | INTRAVENOUS | Status: AC
  Administered 2021-02-02: 1000 mL via INTRAVENOUS

## 2021-02-02 MED ORDER — METOCLOPRAMIDE HCL 5 MG/ML IJ SOLN
10.0000 mg | Freq: Once | INTRAMUSCULAR | Status: DC
  Filled 2021-02-02: qty 2

## 2021-02-02 MED ORDER — ACETAMINOPHEN 325 MG PO TABS
650.0000 mg | ORAL_TABLET | Freq: Once | ORAL | Status: AC
  Administered 2021-02-02: 650 mg via ORAL
  Filled 2021-02-02: qty 2

## 2021-02-02 MED ORDER — DEXAMETHASONE SODIUM PHOSPHATE 10 MG/ML IJ SOLN
10.0000 mg | Freq: Once | INTRAMUSCULAR | Status: AC
  Administered 2021-02-02: 10 mg via INTRAVENOUS
  Filled 2021-02-02: qty 1

## 2021-02-02 NOTE — ED Notes (Signed)
Pt arrives to room, with c/o migraine, last 12 years ago.  Given tylenol and ibuprofen while in lobby, states no relief, vomited x3 yesterday none today.  Denies blurry vision, sensitive to light and sound.  Does report seeing black floaters in vision.

## 2021-02-02 NOTE — ED Provider Notes (Signed)
[facility]   CSN: [id number] Arrival date & time: 02/01/21  2245     History Chief Complaint  Patient presents with   Migraine    Eugene Steele is a 31 y.o. male presents to the ED for evaluation of throbbing headache since yesterday with nausea and three episodes of vomiting. He endorses some photophobia and phonophobia. Denies any neck pain, abdominal pain, blurry vision, chest pain, or SOB. Denies any medical or surgical history. Denies any daily medications. NKDA. Daily smoker. Denies any EtOH or illicit drug use.     Migraine Associated symptoms include headaches. Pertinent negatives include no chest pain, no abdominal pain and no shortness of breath.      Home Medications Prior to Admission medications   Medication Sig Start Date End Date Taking? Authorizing Provider  ibuprofen (ADVIL) 600 MG tablet Take 1 tablet (600 mg total) by mouth every 6 (six) hours as needed. 02/02/21  Yes Achille Richansom, Warnell Rasnic, PA-C  ondansetron (ZOFRAN) 4 MG tablet Take 1 tablet (4 mg total) by mouth every 6 (six) hours. 02/02/21  Yes Achille Richansom, Jerad Dunlap, PA-C  hydrOXYzine (ATARAX/VISTARIL) 25 MG tablet Take 1 tablet (25 mg total) by mouth 3 (three) times daily as needed for anxiety. 12/01/20   Lauro FranklinPashayan, Alexander S, MD  valACYclovir (VALTREX) 1000 MG tablet Take 1 tablet (1,000 mg total) by mouth 3 (three) times daily. 12/05/20   Molpus, Jonny RuizJohn, MD      Allergies    Patient has no known allergies.    Review of Systems   Review of Systems  Eyes:  Positive for photophobia.  Respiratory:  Negative for shortness of breath.   Cardiovascular:  Negative for chest pain.  Gastrointestinal:  Positive for nausea and vomiting. Negative for abdominal pain, constipation and diarrhea.  Musculoskeletal:  Negative for neck pain.  Neurological:  Positive for headaches. Negative for dizziness, weakness and light-headedness.   Physical Exam Updated Vital Signs BP  110/75    Pulse 74    Temp 98.5 F (36.9 C) (Oral)    Resp 16    SpO2 99%  Physical Exam Constitutional:      General: He is not in acute distress.    Appearance: Normal appearance. He is not toxic-appearing.  Eyes:     General: No scleral icterus. Neck:     Comments: The patient is able to move his head side to side Pulmonary:     Effort: Pulmonary effort is normal. No respiratory distress.  Musculoskeletal:        General: No deformity.     Cervical back: No rigidity.  Skin:    General: Skin is dry.     Coloration: Skin is not jaundiced.  Neurological:     General: No focal deficit present.     Mental Status: He is alert. Mental status is at baseline.   The patient was uncooperative with my exam and refused to let me assess him.   ED Results / Procedures / Treatments   Labs (all labs ordered are listed, but only abnormal results are displayed) Labs Reviewed  BASIC METABOLIC PANEL - Abnormal; Notable for the following components:      Result Value   Glucose, Bld 110 (*)    Creatinine, Ser 1.28 (*)    All other components within normal limits  CBC WITH DIFFERENTIAL/PLATELET - Abnormal; Notable for the following components:   WBC 11.8 (*)    All other components within normal limits    EKG  None  Radiology CT Head Wo Contrast  Result Date: 02/02/2021 CLINICAL DATA:  Headache, chronic, new features or increased frequency EXAM: CT HEAD WITHOUT CONTRAST TECHNIQUE: Contiguous axial images were obtained from the base of the skull through the vertex without intravenous contrast. RADIATION DOSE REDUCTION: This exam was performed according to the departmental dose-optimization program which includes automated exposure control, adjustment of the mA and/or kV according to patient size and/or use of iterative reconstruction technique. COMPARISON:  None. FINDINGS: Brain: No acute intracranial abnormality. Specifically, no hemorrhage, hydrocephalus, mass lesion, acute infarction, or  significant intracranial injury. Vascular: No hyperdense vessel or unexpected calcification. Skull: No acute calvarial abnormality. Sinuses/Orbits: No acute findings Other: None IMPRESSION: Normal study. Electronically Signed   By: Charlett Nose M.D.   On: 02/02/2021 00:11    Procedures Procedures   Medications Ordered in ED Medications  ibuprofen (ADVIL) tablet 600 mg (600 mg Oral Given 02/01/21 2318)  ondansetron (ZOFRAN-ODT) disintegrating tablet 4 mg (4 mg Oral Given 02/01/21 2319)  prochlorperazine (COMPAZINE) tablet 10 mg (10 mg Oral Given 02/02/21 0555)  diphenhydrAMINE (BENADRYL) capsule 25 mg (25 mg Oral Given 02/02/21 0554)  acetaminophen (TYLENOL) tablet 650 mg (650 mg Oral Given 02/02/21 0554)  lactated ringers bolus 1,000 mL (0 mLs Intravenous Stopped 02/02/21 1013)  ketorolac (TORADOL) 15 MG/ML injection 15 mg (15 mg Intravenous Given 02/02/21 0907)  dexamethasone (DECADRON) injection 10 mg (10 mg Intravenous Given 02/02/21 0908)    ED Course/ Medical Decision Making/ A&P                           Medical Decision Making Risk Prescription drug management.   31 y/o M presents to the ED for evaluation of headache for the past two days.  Frontal diagnosis includes but is not limited to headache, sinusitis, migraine, SAH, meningitis.  Vital signs are stable.  Patient is normotensive, afebrile, satting 99% on room air.  Physical exam is extremely limited due to patient's cooperation, but the patient is alert, well-appearing, and moving all extremities spontaneously.  The nurse reports that she visualized him walking to the bathroom with a normal gait as well.  The patient is speaking and responding to questions with normal speech.  He does not appear to be in any respiratory distress and is not toxic appearing.  He is moving his head to look at both me and the nurse.  The patient reports photophobia and phonophobia although has the TV on and returned it to full volume after I left the room.   Will order migraine cocktail of fluids, Decadron, and Toradol.  Before being evaluated by me he was given Tylenol, ibuprofen, Compazine, and Zofran.  I independently reviewed and interpreted the patient's labs and imaging.  BMPs shows slightly elevated creatinine from previous.  Slightly elevated glucose at 110.  CBC shows elevated white blood cell count at 11.8 although this is downtrending from patient's previous a few days ago.  I agree with radiologist finding of the CT head showing no acute intracranial abnormality.  On evaluation, the patient is sitting comfortably in bed watching television at full volume and reports that his headache feels better.  On secondary evaluation, the patient is still sitting comfortably in bed watching television a few full volume and reports his headache is feeling better still.  He is requesting food and a bus pass.    At this time with the normal labs and imaging, will discharge home.  Low suspicion  for meningitis and subarachnoid hemorrhage given reassuring labs and imaging.  We will send patient home with Zofran and prescription strength ibuprofen.  Strict return precautions discussed with the patient.  Patient agrees with plan.  Patient is stable being discharged home in good condition.  Final Clinical Impression(s) / ED Diagnoses Final diagnoses:  Bad headache    Rx / DC Orders ED Discharge Orders          Ordered    ibuprofen (ADVIL) 600 MG tablet  Every 6 hours PRN        02/02/21 1132    ondansetron (ZOFRAN) 4 MG tablet  Every 6 hours        02/02/21 1132              Achille Rich, PA-C 02/02/21 1739    Jacalyn Lefevre, MD 02/03/21 [phone]

## 2021-02-02 NOTE — ED Notes (Signed)
Waiting for medication from pharmacy.

## 2021-02-02 NOTE — ED Notes (Signed)
Limited exam, pt refuses to continue stating was examined earlier out front.

## 2021-02-02 NOTE — Discharge Instructions (Addendum)
You were seen here today for evaluation of your headache. You lab work and CT of you head were normal. This was likely a migraine headache. I have prescribed you ibuprofen 600mg  to take every 6 hours as needed for pain. Additionally, I have prescribed you Zofran to take as needed for nausea. If you have any worsening headache, blurry vision, weakness, fever, please return to the nearest ED for evaluation.

## 2021-02-08 ENCOUNTER — Emergency Department (HOSPITAL_COMMUNITY)
Admission: EM | Admit: 2021-02-08 | Discharge: 2021-02-08 | Disposition: A | Discharge status: Home / Self Care | Payer: Self-pay | Attending: Emergency Medicine | Admitting: Emergency Medicine

## 2021-02-08 ENCOUNTER — Emergency Department (HOSPITAL_COMMUNITY)
Admission: EM | Admit: 2021-02-08 | Discharge: 2021-02-09 | Disposition: A | Discharge status: Home / Self Care | Payer: Self-pay | Attending: Emergency Medicine | Admitting: Emergency Medicine

## 2021-02-08 DIAGNOSIS — G43809 Other migraine, not intractable, without status migrainosus: Secondary | ICD-10-CM | POA: Insufficient documentation

## 2021-02-08 DIAGNOSIS — F489 Nonpsychotic mental disorder, unspecified: Secondary | ICD-10-CM

## 2021-02-08 DIAGNOSIS — R519 Headache, unspecified: Secondary | ICD-10-CM | POA: Insufficient documentation

## 2021-02-08 DIAGNOSIS — Z79899 Other long term (current) drug therapy: Secondary | ICD-10-CM | POA: Insufficient documentation

## 2021-02-08 DIAGNOSIS — R0789 Other chest pain: Secondary | ICD-10-CM | POA: Insufficient documentation

## 2021-02-08 DIAGNOSIS — F99 Mental disorder, not otherwise specified: Secondary | ICD-10-CM | POA: Insufficient documentation

## 2021-02-08 LAB — BASIC METABOLIC PANEL
Anion gap: 11 (ref 5–15)
BUN: 10 mg/dL (ref 6–20)
CO2: 22 mmol/L (ref 22–32)
Calcium: 9.3 mg/dL (ref 8.9–10.3)
Chloride: 107 mmol/L (ref 98–111)
Creatinine, Ser: 1.22 mg/dL (ref 0.61–1.24)
GFR, Estimated: >60 mL/min (ref >60)
Glucose, Bld: 88 mg/dL (ref 70–99)
Potassium: 3.9 mmol/L (ref 3.5–5.1)
Sodium: 140 mmol/L (ref 135–145)

## 2021-02-08 LAB — CBC WITH DIFFERENTIAL/PLATELET
Abs Immature Granulocytes: 0.05 10*3/uL (ref 0.00–0.07)
Basophils Absolute: 0.1 10*3/uL (ref 0.0–0.1)
Basophils Relative: 0 %
Eosinophils Absolute: 0.2 10*3/uL (ref 0.0–0.5)
Eosinophils Relative: 2 %
HCT: 44.8 % (ref 39.0–52.0)
Hemoglobin: 14.6 g/dL (ref 13.0–17.0)
Immature Granulocytes: 0 %
Lymphocytes Relative: 16 %
Lymphs Abs: 2.2 10*3/uL (ref 0.7–4.0)
MCH: 28.4 pg (ref 26.0–34.0)
MCHC: 32.6 g/dL (ref 30.0–36.0)
MCV: 87.2 fL (ref 80.0–100.0)
Monocytes Absolute: 0.8 10*3/uL (ref 0.1–1.0)
Monocytes Relative: 6 %
Neutro Abs: 10.5 10*3/uL — ABNORMAL HIGH (ref 1.7–7.7)
Neutrophils Relative %: 76 %
Platelets: 268 10*3/uL (ref 150–400)
RBC: 5.14 MIL/uL (ref 4.22–5.81)
RDW: 13.5 % (ref 11.5–15.5)
WBC: 13.9 10*3/uL — ABNORMAL HIGH (ref 4.0–10.5)
nRBC: 0 % (ref 0.0–0.2)

## 2021-02-08 LAB — ETHANOL: Alcohol, Ethyl (B): <10 mg/dL (ref <10)

## 2021-02-08 MED ORDER — DIPHENHYDRAMINE HCL 50 MG/ML IJ SOLN
12.5000 mg | Freq: Once | INTRAMUSCULAR | Status: AC
  Administered 2021-02-08: 12.5 mg via INTRAVENOUS
  Filled 2021-02-08: qty 1

## 2021-02-08 MED ORDER — DEXAMETHASONE SODIUM PHOSPHATE 10 MG/ML IJ SOLN
10.0000 mg | Freq: Once | INTRAMUSCULAR | Status: AC
  Administered 2021-02-08: 10 mg via INTRAVENOUS
  Filled 2021-02-08: qty 1

## 2021-02-08 MED ORDER — KETOROLAC TROMETHAMINE 30 MG/ML IJ SOLN
30.0000 mg | Freq: Once | INTRAMUSCULAR | Status: AC
Start: 2021-02-09 — End: 2021-02-09
  Administered 2021-02-09: 30 mg via INTRAVENOUS
  Filled 2021-02-08: qty 1

## 2021-02-08 MED ORDER — SODIUM CHLORIDE 0.9 % IV BOLUS
1000.0000 mL | Freq: Once | INTRAVENOUS | Status: AC
Start: 2021-02-08 — End: 2021-02-08
  Administered 2021-02-08: 1000 mL via INTRAVENOUS

## 2021-02-08 MED ORDER — KETOROLAC TROMETHAMINE 15 MG/ML IJ SOLN
15.0000 mg | Freq: Once | INTRAMUSCULAR | Status: AC
  Administered 2021-02-08: 15 mg via INTRAVENOUS
  Filled 2021-02-08: qty 1

## 2021-02-08 MED ORDER — PROCHLORPERAZINE EDISYLATE 10 MG/2ML IJ SOLN
10.0000 mg | Freq: Once | INTRAMUSCULAR | Status: AC
  Administered 2021-02-08: 10 mg via INTRAVENOUS
  Filled 2021-02-08: qty 2

## 2021-02-08 NOTE — BH Assessment (Signed)
Per chart review, @1000  H&P and labs are still pending. Patient is not medically cleared for TTS assessment as of yet.

## 2021-02-08 NOTE — ED Provider Notes (Signed)
[facility] EMERGENCY DEPARTMENT Provider Note   CSN: [id number] Arrival date & time: 02/08/21  0736     History  Chief Complaint  Patient presents with   Headache    Eugene Steele is a 31 y.o. male.  The history is provided by the patient.  Headache Pain location:  L parietal and R parietal Quality:  Dull Radiates to:  Does not radiate Onset quality:  Gradual Duration:  2 days Timing:  Constant Progression:  Unchanged Chronicity:  Recurrent Similar to prior headaches: yes   Context: bright light   Relieved by:  Nothing Worsened by:  Light Associated symptoms: near-syncope (he states headache was so bad this morning he think hes might have passed out while in his bed) and photophobia   Associated symptoms: no abdominal pain, no back pain, no blurred vision, no congestion, no cough, no diarrhea, no dizziness, no drainage, no ear pain, no eye pain, no facial pain, no fatigue, no fever, no focal weakness, no hearing loss, no loss of balance, no myalgias, no nausea, no neck pain, no neck stiffness, no numbness, no paresthesias, no sinus pressure, no sore throat, no tingling, no URI and no vomiting       Home Medications Prior to Admission medications   Medication Sig Start Date End Date Taking? Authorizing Provider  hydrOXYzine (ATARAX/VISTARIL) 25 MG tablet Take 1 tablet (25 mg total) by mouth 3 (three) times daily as needed for anxiety. 12/01/20   Briant Cedar, MD  ibuprofen (ADVIL) 600 MG tablet Take 1 tablet (600 mg total) by mouth every 6 (six) hours as needed. 02/02/21   Sherrell Puller, PA-C  ondansetron (ZOFRAN) 4 MG tablet Take 1 tablet (4 mg total) by mouth every 6 (six) hours. 02/02/21   Sherrell Puller, PA-C  valACYclovir (VALTREX) 1000 MG tablet Take 1 tablet (1,000 mg total) by mouth 3 (three) times daily. 12/05/20   Molpus, Jenny Reichmann, MD      Allergies    Patient has no known allergies.    Review of Systems   Review of Systems   Constitutional:  Negative for fatigue and fever.  HENT:  Negative for congestion, ear pain, hearing loss, postnasal drip, sinus pressure and sore throat.   Eyes:  Positive for photophobia. Negative for blurred vision and pain.  Respiratory:  Negative for cough.   Cardiovascular:  Positive for near-syncope (he states headache was so bad this morning he think hes might have passed out while in his bed).  Gastrointestinal:  Negative for abdominal pain, diarrhea, nausea and vomiting.  Musculoskeletal:  Negative for back pain, myalgias, neck pain and neck stiffness.  Neurological:  Positive for headaches. Negative for dizziness, focal weakness, numbness, paresthesias and loss of balance.   Physical Exam Updated Vital Signs BP (!) 149/87 (BP Location: Right Arm)    Pulse 65    Temp 98.5 F (36.9 C) (Oral)    Resp 16    SpO2 100%  Physical Exam Vitals and nursing note reviewed.  Constitutional:      General: He is not in acute distress.    Appearance: He is well-developed. He is not ill-appearing.  HENT:     Head: Normocephalic and atraumatic.  Eyes:     General: No visual field deficit.    Extraocular Movements: Extraocular movements intact.     Right eye: Normal extraocular motion and no nystagmus.     Left eye: Normal extraocular motion and no nystagmus.     Conjunctiva/sclera: Conjunctivae normal.  Pupils: Pupils are equal, round, and reactive to light.     Right eye: Pupil is round and reactive.     Left eye: Pupil is round and reactive.  Neck:     Meningeal: Brudzinski's sign and Kernig's sign absent.  Cardiovascular:     Rate and Rhythm: Normal rate and regular rhythm.     Heart sounds: Normal heart sounds. No murmur heard. Pulmonary:     Effort: Pulmonary effort is normal. No respiratory distress.     Breath sounds: Normal breath sounds.  Abdominal:     Palpations: Abdomen is soft.     Tenderness: There is no abdominal tenderness.  Musculoskeletal:        General: No  swelling or tenderness. Normal range of motion.     Cervical back: Normal range of motion and neck supple. No rigidity.  Skin:    General: Skin is warm and dry.     Capillary Refill: Capillary refill takes less than 2 seconds.  Neurological:     Mental Status: He is alert and oriented to person, place, and time.     Cranial Nerves: No cranial nerve deficit, dysarthria or facial asymmetry.     Sensory: No sensory deficit.     Motor: No weakness.     Coordination: Coordination normal.     Gait: Gait normal.     Comments: 5+ out of 5 strength throughout, normal sensation, no drift, normal finger-to-nose finger, normal speech  Psychiatric:        Mood and Affect: Mood normal.        Speech: Speech normal.    ED Results / Procedures / Treatments   Labs (all labs ordered are listed, but only abnormal results are displayed) Labs Reviewed  CBC WITH DIFFERENTIAL/PLATELET - Abnormal; Notable for the following components:      Result Value   WBC 13.9 (*)    Neutro Abs 10.5 (*)    All other components within normal limits  BASIC METABOLIC PANEL  ETHANOL  RAPID URINE DRUG SCREEN, HOSP PERFORMED    EKG None  Radiology No results found.  Procedures Procedures    Medications Ordered in ED Medications  sodium chloride 0.9 % bolus 1,000 mL (0 mLs Intravenous Stopped 02/08/21 0926)  prochlorperazine (COMPAZINE) injection 10 mg (10 mg Intravenous Given 02/08/21 0820)  diphenhydrAMINE (BENADRYL) injection 12.5 mg (12.5 mg Intravenous Given 02/08/21 0820)  dexamethasone (DECADRON) injection 10 mg (10 mg Intravenous Given 02/08/21 0820)  ketorolac (TORADOL) 15 MG/ML injection 15 mg (15 mg Intravenous Given 02/08/21 0930)    ED Course/ Medical Decision Making/ A&P                           Medical Decision Making Amount and/or Complexity of Data Reviewed Labs: ordered. Radiology: ordered.  Risk Prescription drug management.   [name] is here with headache.  No significant  medical history.  However upon chart review does appear to have may be a history of polysubstance abuse including cocaine per review drug screens.  States that he has had a headache for the last 2 days.  Had a headache a week or so ago as well.  Used to have migraines in the past but has not had them since last month.  Denies any weakness or numbness or vision changes.  He states the headache was so bad this morning that he thought he passed out while in bed.  There is no signs of  trauma on exam.  Neurologically appears to be intact.  He has had photophobia and nausea and vomiting.  Denies any fever or chills.  No neck pain or neck stiffness.  Differential diagnosis includes migraine versus dehydration versus head bleed.  To evaluate we will get basic labs including CBC and BMP.  We will pursue head CT given questionable loss of consciousness.  Does not describe any seizure activity.  Will give IV fluids, IV Compazine, IV Benadryl, IV Decadron and reevaluate for headache.  8 am patient did not want head CT.  Did have a head CT last week that was unremarkable.  He has capacity make this decision.  Overall I have a low suspicion for head bleed but thought that head CT was reasonable given his history and physical.  We will continue with headache cocktail reevaluation.  10 am lab work is overall unremarkable.  Headache has much improved.  Talked with the patient and with his grandmother and there is concerned about his mental health.  He has endorsed suicidal thoughts to his grandmother in the past.  Does not have any active suicidal plans but would like to talk with psychiatry.  He is fairly withdrawn about his mental health issues.  We will have him talk with mental health team.  He is medically cleared at this time.  This chart was dictated using voice recognition software.  Despite best efforts to proofread,  errors can occur which can change the documentation meaning.         Final Clinical  Impression(s) / ED Diagnoses Final diagnoses:  Other migraine without status migrainosus, not intractable  Mental health problem    Rx / DC Orders ED Discharge Orders     None         Lennice Sites, DO 02/08/21 1014

## 2021-02-08 NOTE — ED Notes (Signed)
Patient sleeping respirations even and unlabored, did not awaken to blood draw and verbal request. MD notified.
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
## 2021-02-08 NOTE — ED Notes (Signed)
When cleaning room patient was in, this RN found an IV catheter, Tegaderm, and drops of blood in the trash can. It is suspected that the patient removed his own IV.

## 2021-02-08 NOTE — ED Notes (Signed)
Patient transported to X-ray 

## 2021-02-08 NOTE — ED Notes (Addendum)
Patient not present in room and cannot be located in the department. Patient is not an IVC and per previous RN, there was not discussion with provider about pursuing IVC paperwork in the event the patient attempts to elope. Attempted to call patient at phone number listed in chart regarding removal of IV. No answer and not option to leave voicemail. Charge nurse made aware.

## 2021-02-08 NOTE — ED Notes (Signed)
Attempted to call patient again. No answer. No option to leave voicemail.

## 2021-02-08 NOTE — ED Triage Notes (Signed)
Pt c/o chest "discomfort" x "a couple hours." Denies N/V, diaphoresis, SHOB, radiation  138/76 HR 76, EKG unremarkable 324 ASA

## 2021-02-08 NOTE — ED Notes (Signed)
ED Provider at bedside. 

## 2021-02-09 ENCOUNTER — Encounter (HOSPITAL_COMMUNITY): Payer: Self-pay

## 2021-02-09 ENCOUNTER — Emergency Department (HOSPITAL_COMMUNITY): Payer: Self-pay

## 2021-02-09 ENCOUNTER — Other Ambulatory Visit: Payer: Self-pay

## 2021-02-09 LAB — CBC WITH DIFFERENTIAL/PLATELET
Abs Immature Granulocytes: 0.05 10*3/uL (ref 0.00–0.07)
Basophils Absolute: 0.1 10*3/uL (ref 0.0–0.1)
Basophils Relative: 1 %
Eosinophils Absolute: 0.3 10*3/uL (ref 0.0–0.5)
Eosinophils Relative: 3 %
HCT: 43.2 % (ref 39.0–52.0)
Hemoglobin: 14.8 g/dL (ref 13.0–17.0)
Immature Granulocytes: 0 %
Lymphocytes Relative: 28 %
Lymphs Abs: 3.3 10*3/uL (ref 0.7–4.0)
MCH: 29.3 pg (ref 26.0–34.0)
MCHC: 34.3 g/dL (ref 30.0–36.0)
MCV: 85.5 fL (ref 80.0–100.0)
Monocytes Absolute: 0.8 10*3/uL (ref 0.1–1.0)
Monocytes Relative: 7 %
Neutro Abs: 7.4 10*3/uL (ref 1.7–7.7)
Neutrophils Relative %: 61 %
Platelets: 253 10*3/uL (ref 150–400)
RBC: 5.05 MIL/uL (ref 4.22–5.81)
RDW: 13.6 % (ref 11.5–15.5)
WBC: 11.9 10*3/uL — ABNORMAL HIGH (ref 4.0–10.5)
nRBC: 0 % (ref 0.0–0.2)

## 2021-02-09 LAB — BASIC METABOLIC PANEL
Anion gap: 7 (ref 5–15)
BUN: 11 mg/dL (ref 6–20)
CO2: 23 mmol/L (ref 22–32)
Calcium: 8.6 mg/dL — ABNORMAL LOW (ref 8.9–10.3)
Chloride: 108 mmol/L (ref 98–111)
Creatinine, Ser: 1.13 mg/dL (ref 0.61–1.24)
GFR, Estimated: >60 mL/min (ref >60)
Glucose, Bld: 115 mg/dL — ABNORMAL HIGH (ref 70–99)
Potassium: 4 mmol/L (ref 3.5–5.1)
Sodium: 138 mmol/L (ref 135–145)

## 2021-02-09 LAB — TROPONIN I (HIGH SENSITIVITY)
Troponin I (High Sensitivity): 6 ng/L (ref <18)
Troponin I (High Sensitivity): 7 ng/L (ref <18)

## 2021-02-09 LAB — D-DIMER, QUANTITATIVE: D-Dimer, Quant: 5.35 ug/mL-FEU — ABNORMAL HIGH (ref 0.00–0.50)

## 2021-02-09 LAB — ETHANOL: Alcohol, Ethyl (B): <10 mg/dL (ref <10)

## 2021-02-09 MED ORDER — HYDROCODONE-ACETAMINOPHEN 5-325 MG PO TABS
1.0000 | ORAL_TABLET | Freq: Once | ORAL | Status: AC
  Administered 2021-02-09: 1 via ORAL
  Filled 2021-02-09: qty 1

## 2021-02-09 MED ORDER — IOHEXOL 350 MG/ML SOLN
125.0000 mL | Freq: Once | INTRAVENOUS | Status: AC | PRN
  Administered 2021-02-09: 125 mL via INTRAVENOUS

## 2021-02-09 MED ORDER — IBUPROFEN 800 MG PO TABS: 800.0000 mg | ORAL_TABLET | Freq: Three times a day (TID) | ORAL | 0 refills | Status: AC | PRN

## 2021-02-09 NOTE — ED Notes (Signed)
Pt returned to room from Xray. 

## 2021-02-09 NOTE — Discharge Instructions (Signed)
There is no evidence of heart attack or blood clot in the lung.  Take the anti-inflammatories as prescribed.  Follow-up with your primary doctor.  Return to the ED with new or worsening symptoms

## 2021-02-09 NOTE — ED Notes (Signed)
Bus pass provided to pt.

## 2021-02-09 NOTE — ED Notes (Signed)
E-signature pad unavailable at time of pt discharge. This RN discussed discharge materials with pt and answered all pt questions. Pt stated understanding of discharge material. ? ?

## 2021-02-09 NOTE — ED Notes (Signed)
Ginger ale and saltine crackers provided to pt

## 2021-09-01 ENCOUNTER — Emergency Department (HOSPITAL_COMMUNITY)
Admission: EM | Admit: 2021-09-01 | Discharge: 2021-09-01 | Discharge status: Against Medical Advice | Payer: Self-pay | Attending: Emergency Medicine | Admitting: Emergency Medicine

## 2021-09-01 ENCOUNTER — Emergency Department (HOSPITAL_COMMUNITY): Payer: Self-pay

## 2021-09-01 ENCOUNTER — Other Ambulatory Visit: Payer: Self-pay

## 2021-09-01 DIAGNOSIS — R109 Unspecified abdominal pain: Secondary | ICD-10-CM | POA: Insufficient documentation

## 2021-09-01 DIAGNOSIS — Z5321 Procedure and treatment not carried out due to patient leaving prior to being seen by health care provider: Secondary | ICD-10-CM | POA: Insufficient documentation

## 2021-09-01 LAB — CBG MONITORING, ED: Glucose-Capillary: 108 mg/dL — ABNORMAL HIGH (ref 70–99)

## 2021-09-01 MED ORDER — IBUPROFEN 800 MG PO TABS
800.0000 mg | ORAL_TABLET | Freq: Once | ORAL | Status: AC
  Administered 2021-09-01: 800 mg via ORAL
  Filled 2021-09-01: qty 1

## 2021-09-01 NOTE — ED Provider Notes (Signed)
I signed up for patient to go see him in the treatment room but the nurse informed me that just prior to this the patient got up, took all of his things and left.  I did not get a chance to evaluate him.   Pricilla Loveless, MD 09/01/21 [phone]

## 2021-09-01 NOTE — ED Notes (Addendum)
Pt became visibly upset when RN asked pt what brought him in today. PT states, " Can't you look in my chart." RN said she will look in chart but, wanted to have a more in depth conversation about brought the pt in . Pt states, " I am tired of repeating myself." This RN apologized and pt walked out of room. MD Criss Alvine made aware.

## 2021-09-01 NOTE — ED Provider Triage Note (Signed)
Emergency Medicine Provider Triage Evaluation Note  Eugene Steele , a 31 y.o. male  was evaluated in triage.  Pt complains of atraumatic pain to R flank. Aggravated with inspiration. Associated with urinary frequency. No meds PTA. Denies fevers, dysuria, N/V.  Review of Systems  Positive: As above Negative: As above  Physical Exam  BP 124/75 (BP Location: Right Arm)   Pulse 62   Temp 98.4 F (36.9 C) (Oral)   Resp 15   SpO2 96%  Gen:   Awake, no distress   Resp:  Normal effort  MSK:   Moves extremities without difficulty  Other:  No TTP to R flank. Chest expansion symmetric.  Medical Decision Making  Medically screening exam initiated at 2:47 AM.  Appropriate orders placed.  Eugene Steele was informed that the remainder of the evaluation will be completed by another provider, this initial triage assessment does not replace that evaluation, and the importance of remaining in the ED until their evaluation is complete.  R flank pain - pending CBG, UA, right rib series   Antony Madura, PA-C 09/01/21 0249

## 2021-09-01 NOTE — ED Notes (Signed)
Pt left before being seen. Note in chart. MD Criss Alvine aware.

## 2021-09-01 NOTE — ED Triage Notes (Addendum)
Pt here via [facility] from outside the [facility] for R side flank pain, no obvious injury/trauma, pt also endorses increased urination, denies dysuria, 72HR, 96%RA

## 2021-09-05 ENCOUNTER — Emergency Department (HOSPITAL_COMMUNITY)
Admission: EM | Admit: 2021-09-05 | Discharge: 2021-09-05 | Disposition: A | Discharge status: Home / Self Care | Payer: Self-pay | Attending: Emergency Medicine | Admitting: Emergency Medicine

## 2021-09-05 DIAGNOSIS — R519 Headache, unspecified: Secondary | ICD-10-CM | POA: Insufficient documentation

## 2021-09-05 MED ORDER — METOCLOPRAMIDE HCL 5 MG/ML IJ SOLN
10.0000 mg | Freq: Once | INTRAMUSCULAR | Status: AC
  Administered 2021-09-05: 10 mg via INTRAMUSCULAR
  Filled 2021-09-05: qty 2

## 2021-09-05 MED ORDER — DIPHENHYDRAMINE HCL 50 MG/ML IJ SOLN
25.0000 mg | Freq: Once | INTRAMUSCULAR | Status: AC
  Administered 2021-09-05: 25 mg via INTRAMUSCULAR
  Filled 2021-09-05: qty 1

## 2021-09-05 MED ORDER — DICLOFENAC SODIUM 75 MG PO TBEC: DELAYED_RELEASE_TABLET | ORAL | 0 refills | Status: AC

## 2021-09-05 MED ORDER — KETOROLAC TROMETHAMINE 30 MG/ML IJ SOLN
30.0000 mg | Freq: Once | INTRAMUSCULAR | Status: AC
  Administered 2021-09-05: 30 mg via INTRAMUSCULAR
  Filled 2021-09-05: qty 1

## 2021-09-05 NOTE — Discharge Instructions (Addendum)
Return if any problems.  Drink plenty of fluids.   °
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
## 2021-09-05 NOTE — ED Provider Notes (Signed)
[facility] EMERGENCY DEPARTMENT Provider Note   CSN: [id number] Arrival date & time: 09/05/21  0344     History  Chief Complaint  Patient presents with   Migraine    Eugene Steele is a 31 y.o. male.  Pt sleeping at the time of evaluation.  Pt complains of a headache.  Pt reports he has a headache and is light sensitive.  Pt reports he has a history of headaches.  Pt denies fever or chills.  No vomiting.    The history is provided by the patient. No language interpreter was used.  Migraine This is a recurrent problem. The current episode started 6 to 12 hours ago. The problem occurs every several days. The problem has been gradually worsening. Associated symptoms include headaches. Pertinent negatives include no chest pain. Nothing aggravates the symptoms. Nothing relieves the symptoms. He has tried nothing for the symptoms. The treatment provided no relief.       Home Medications Prior to Admission medications   Medication Sig Start Date End Date Taking? Authorizing Provider  hydrOXYzine (ATARAX/VISTARIL) 25 MG tablet Take 1 tablet (25 mg total) by mouth 3 (three) times daily as needed for anxiety. Patient not taking: Reported on 02/09/2021 12/01/20   Lauro Franklin, MD  ibuprofen (ADVIL) 800 MG tablet Take 1 tablet (800 mg total) by mouth every 8 (eight) hours as needed for moderate pain. 02/09/21   Rancour, Jeannett Senior, MD  ondansetron (ZOFRAN) 4 MG tablet Take 1 tablet (4 mg total) by mouth every 6 (six) hours. Patient not taking: Reported on 02/09/2021 02/02/21   Achille Rich, PA-C  valACYclovir (VALTREX) 1000 MG tablet Take 1 tablet (1,000 mg total) by mouth 3 (three) times daily. Patient not taking: Reported on 02/09/2021 12/05/20   Molpus, Jonny Ruiz, MD      Allergies    Patient has no known allergies.    Review of Systems   Review of Systems  Cardiovascular:  Negative for chest pain.  Neurological:  Positive for headaches.  All other systems  reviewed and are negative.   Physical Exam Updated Vital Signs BP (!) 150/97 (BP Location: Right Arm)   Pulse 83   Temp 98 F (36.7 C) (Oral)   Resp 18   SpO2 100%  Physical Exam Vitals and nursing note reviewed.  Constitutional:      General: He is not in acute distress.    Appearance: He is well-developed.  HENT:     Head: Normocephalic and atraumatic.  Eyes:     Conjunctiva/sclera: Conjunctivae normal.  Cardiovascular:     Rate and Rhythm: Normal rate and regular rhythm.     Heart sounds: No murmur heard. Pulmonary:     Effort: Pulmonary effort is normal. No respiratory distress.     Breath sounds: Normal breath sounds.  Abdominal:     Tenderness: There is no abdominal tenderness.  Musculoskeletal:        General: No swelling.     Cervical back: Neck supple.  Skin:    General: Skin is warm and dry.     Capillary Refill: Capillary refill takes less than 2 seconds.  Neurological:     Mental Status: He is alert.  Psychiatric:        Mood and Affect: Mood normal.     ED Results / Procedures / Treatments   Labs (all labs ordered are listed, but only abnormal results are displayed) Labs Reviewed - No data to display  EKG None  Radiology No results  found.  Procedures Procedures    Medications Ordered in ED Medications - No data to display  ED Course/ Medical Decision Making/ A&P                           Medical Decision Making Pt has a history of headaches.  Pt reports headache today.    Amount and/or Complexity of Data Reviewed External Data Reviewed: notes.    Details: Previous ED notes reviewed   Risk Prescription drug management. Risk Details: Pt given IM injection or toradol, reglan and benadryl.  Pt sleeping after injection.   Pt reevaluated.  He feels better.  Pt able to eat and drink.  Pt given prescription for voltaren            Final Clinical Impression(s) / ED Diagnoses Final diagnoses:  Bad headache    Rx / DC  Orders ED Discharge Orders          Ordered    diclofenac (VOLTAREN) 75 MG EC tablet        09/05/21 1154           An After Visit Summary was printed and given to the patient.    Elson Areas, [place] 09/05/21 1404    Nira Conn, MD 09/07/21 [phone]

## 2021-09-05 NOTE — ED Triage Notes (Signed)
Pt brought to ED by [facility] with c/o headache x1 day. Pt presents as very confrontational during triage
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
## 2021-11-12 DEATH — deceased
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
